# Patient Record
Sex: Male | Born: 1950
Health system: Southern US, Community
[De-identification: ages and names within clinical notes are randomized; demographics above are authoritative.]

## PROBLEM LIST (undated history)

## (undated) HISTORY — PX: SHOULDER SURGERY: SHX246

## (undated) HISTORY — PX: BACK SURGERY: SHX140

## (undated) HISTORY — PX: HEMORRHOID SURGERY: SHX153

---

## 1999-05-20 ENCOUNTER — Emergency Department (HOSPITAL_COMMUNITY): Admission: EM | Admit: 1999-05-20 | Discharge: 1999-05-20 | Payer: Self-pay | Admitting: Emergency Medicine

## 2009-05-29 ENCOUNTER — Emergency Department (HOSPITAL_BASED_OUTPATIENT_CLINIC_OR_DEPARTMENT_OTHER): Admission: EM | Admit: 2009-05-29 | Discharge: 2009-05-29 | Payer: Self-pay | Admitting: Emergency Medicine

## 2009-10-05 ENCOUNTER — Emergency Department (HOSPITAL_BASED_OUTPATIENT_CLINIC_OR_DEPARTMENT_OTHER): Admission: EM | Admit: 2009-10-05 | Discharge: 2009-10-05 | Payer: Self-pay | Admitting: Emergency Medicine

## 2010-07-09 LAB — GRAM STAIN

## 2010-07-09 LAB — SYNOVIAL CELL COUNT + DIFF, W/ CRYSTALS: Lymphocytes-Synovial Fld: 20 % (ref 0–20)

## 2010-07-09 LAB — BODY FLUID CULTURE: Culture: NO GROWTH

## 2010-07-09 LAB — CULTURE, BODY FLUID W GRAM STAIN -BOTTLE: Culture: NO GROWTH

## 2010-07-09 LAB — PATHOLOGIST SMEAR REVIEW

## 2010-07-09 LAB — GLUCOSE, SYNOVIAL FLUID: Glucose, Synovial Fluid: 85 mg/dL

## 2010-07-13 LAB — CBC
Hemoglobin: 13.5 g/dL (ref 13.0–17.0)
MCHC: 34.5 g/dL (ref 30.0–36.0)
RBC: 4.51 MIL/uL (ref 4.22–5.81)

## 2010-07-13 LAB — URINALYSIS, ROUTINE W REFLEX MICROSCOPIC
Ketones, ur: NEGATIVE mg/dL
Nitrite: NEGATIVE
Protein, ur: NEGATIVE mg/dL
Urobilinogen, UA: 1 mg/dL (ref 0.0–1.0)
pH: 6.5 (ref 5.0–8.0)

## 2010-07-13 LAB — COMPREHENSIVE METABOLIC PANEL
ALT: 10 U/L (ref 0–53)
Alkaline Phosphatase: 79 U/L (ref 39–117)
CO2: 29 mEq/L (ref 19–32)
Calcium: 9.1 mg/dL (ref 8.4–10.5)
GFR calc non Af Amer: >60 mL/min (ref >60)
Glucose, Bld: 74 mg/dL (ref 70–99)
Sodium: 142 mEq/L (ref 135–145)

## 2013-12-13 ENCOUNTER — Encounter (HOSPITAL_BASED_OUTPATIENT_CLINIC_OR_DEPARTMENT_OTHER): Payer: Self-pay | Admitting: Emergency Medicine

## 2013-12-13 ENCOUNTER — Emergency Department (HOSPITAL_BASED_OUTPATIENT_CLINIC_OR_DEPARTMENT_OTHER)
Admission: EM | Admit: 2013-12-13 | Discharge: 2013-12-13 | Disposition: A | Discharge status: Home / Self Care | Payer: Medicare Other | Attending: Emergency Medicine | Admitting: Emergency Medicine

## 2013-12-13 DIAGNOSIS — F17200 Nicotine dependence, unspecified, uncomplicated: Secondary | ICD-10-CM | POA: Diagnosis not present

## 2013-12-13 DIAGNOSIS — M7042 Prepatellar bursitis, left knee: Secondary | ICD-10-CM

## 2013-12-13 DIAGNOSIS — M7040 Prepatellar bursitis, unspecified knee: Secondary | ICD-10-CM | POA: Insufficient documentation

## 2013-12-13 DIAGNOSIS — M25569 Pain in unspecified knee: Secondary | ICD-10-CM | POA: Insufficient documentation

## 2013-12-13 DIAGNOSIS — M7090 Unspecified soft tissue disorder related to use, overuse and pressure of unspecified site: Secondary | ICD-10-CM | POA: Insufficient documentation

## 2013-12-13 MED ORDER — HYDROCODONE-ACETAMINOPHEN 5-325 MG PO TABS: 2.0000 | ORAL_TABLET | ORAL | Status: DC | PRN

## 2013-12-13 MED ORDER — IBUPROFEN 800 MG PO TABS
800.0000 mg | ORAL_TABLET | Freq: Once | ORAL | Status: AC
  Administered 2013-12-13: 800 mg via ORAL
  Filled 2013-12-13: qty 1

## 2013-12-13 MED ORDER — HYDROCODONE-ACETAMINOPHEN 5-325 MG PO TABS
1.0000 | ORAL_TABLET | Freq: Once | ORAL | Status: AC
  Administered 2013-12-13: 1 via ORAL
  Filled 2013-12-13: qty 1

## 2013-12-13 MED ORDER — NAPROXEN 500 MG PO TABS: 500.0000 mg | ORAL_TABLET | Freq: Two times a day (BID) | ORAL | Status: DC

## 2013-12-13 NOTE — ED Notes (Signed)
PT discharged to home with family. NAD. 

## 2013-12-13 NOTE — ED Provider Notes (Signed)
CSN: [id number]     Arrival date & time 12/13/13  1806 History  This chart was scribed for Rolland Porter, MD by Evon Slack, ED Scribe. This patient was seen in room MH03/MH03 and the patient's care was started at 6:51 PM.     Chief Complaint  Patient presents with  . Knee Pain   Patient is a 63 y.o. male presenting with knee pain. The history is provided by the patient. No language interpreter was used.  Knee Pain Associated symptoms: no fatigue and no fever    HPI Comments: Donald Frazier is a 63 y.o. male who presents to the Emergency Department complaining of left knee pain onset 2 weeks prior. He states the pain sometimes radiates down into his leg and his back.He states that he is constantly kneeling on the knee because he works in [employer].   He states there was no injury to the knee prior to arrival.     History reviewed. No pertinent past medical history. History reviewed. No pertinent past surgical history. No family history on file. History  Substance Use Topics  . Smoking status: Current Every Day Smoker  . Smokeless tobacco: Not on file  . Alcohol Use: Not on file    Review of Systems  Constitutional: Negative for fever, chills, diaphoresis, appetite change and fatigue.  HENT: Negative for mouth sores, sore throat and trouble swallowing.   Eyes: Negative for visual disturbance.  Respiratory: Negative for cough, chest tightness, shortness of breath and wheezing.   Cardiovascular: Negative for chest pain.  Gastrointestinal: Negative for nausea, vomiting, abdominal pain, diarrhea and abdominal distention.  Endocrine: Negative for polydipsia, polyphagia and polyuria.  Genitourinary: Negative for dysuria, frequency and hematuria.  Musculoskeletal: Positive for arthralgias. Negative for gait problem.  Skin: Negative for color change, pallor and rash.  Neurological: Negative for dizziness, syncope, light-headedness and headaches.  Hematological: Does not  bruise/bleed easily.  Psychiatric/Behavioral: Negative for behavioral problems and confusion.    Allergies  Review of patient's allergies indicates no known allergies.  Home Medications   Prior to Admission medications   Medication Sig Start Date End Date Taking? Authorizing Provider  HYDROcodone-acetaminophen (NORCO/VICODIN) 5-325 MG per tablet Take 2 tablets by mouth every 4 (four) hours as needed. 12/13/13   Rolland Porter, MD  naproxen (NAPROSYN) 500 MG tablet Take 1 tablet (500 mg total) by mouth 2 (two) times daily. 12/13/13   Rolland Porter, MD   Triage Vitals: BP 129/68  Pulse 78  Temp(Src) 98.3 F (36.8 C) (Oral)  Resp 18  Ht 6' 2.5" (1.892 m)  Wt 183 lb (83.008 kg)  BMI 23.19 kg/m2  SpO2 96%  Physical Exam  Musculoskeletal:  Left Knee: Diffuse swelling anteriorly, palpable distention of pre-patellar bursa   no palpable effusion or ballottement of the patella. No swelling posteriorly to suggest Baker's cyst.  ED Course  Procedures (including critical care time) DIAGNOSTIC STUDIES: Oxygen Saturation is 96% on RA, adequate by my interpretation.    COORDINATION OF CARE:    Labs Review Labs Reviewed - No data to display  Imaging Review No results found.   EKG Interpretation None      MDM   Final diagnoses:  Prepatellar bursitis, left  Housemaid's knee, left        I personally performed the services described in this documentation, which was scribed in my presence. The recorded information has been reviewed and is accurate.    Rolland Porter, MD 12/13/13 1910

## 2013-12-13 NOTE — ED Notes (Signed)
Pt here for pain and swelling to left knee.  Not associated with any trauma.

## 2013-12-13 NOTE — Discharge Instructions (Signed)
Bursitis Bursitis is a swelling and soreness (inflammation) of a fluid-filled sac (bursa) that overlies and protects a joint. It can be caused by injury, overuse of the joint, arthritis or infection. The joints most likely to be affected are the elbows, shoulders, hips and knees. HOME CARE INSTRUCTIONS   Apply ice to the affected area for 15-20 minutes each hour while awake for 2 days. Put the ice in a plastic bag and place a towel between the bag of ice and your skin.  Rest the injured joint as much as possible, but continue to put the joint through a full range of motion, 4 times per day. (The shoulder joint especially becomes rapidly "frozen" if not used.) When the pain lessens, begin normal slow movements and usual activities.  Only take over-the-counter or prescription medicines for pain, discomfort or fever as directed by your caregiver.  Your caregiver may recommend draining the bursa and injecting medicine into the bursa. This may help the healing process.  Follow all instructions for follow-up with your caregiver. This includes any orthopedic referrals, physical therapy and rehabilitation. Any delay in obtaining necessary care could result in a delay or failure of the bursitis to heal and chronic pain. SEEK IMMEDIATE MEDICAL CARE IF:   Your pain increases even during treatment.  You develop an oral temperature above 102 F (38.9 C) and have heat and inflammation over the involved bursa. MAKE SURE YOU:   Understand these instructions.  Will watch your condition.  Will get help right away if you are not doing well or get worse. Document Released: 04/06/2000 Document Revised: 07/02/2011 Document Reviewed: 06/29/2013 [employer] Patient Information 2015 [place], [place]. This information is not intended to replace advice given to you by your health care provider. Make sure you discuss any questions you have with your health care provider.  Prepatellar Bursitis with Rehab  Bursitis is  a condition that is characterized by inflammation of a bursa. Kateri Mc exists in many areas of the body. They are fluid-filled sacs that lie between a soft tissue (skin, tendon, or ligament) and a bone, and they reduce friction between the structures as well as the stress placed on the soft tissue. Prepatellar bursitis is inflammation of the bursa that lies between the skin and the kneecap (patella). This condition often causes pain over the patella. SYMPTOMS   Pain, tenderness, and/or inflammation over the patella.  Pain that worsens with movement of the knee joint.  Decreased range of motion for the knee joint.  A crackling sound (crepitation) when the bursa is moved or touched.  Occasionally, painless swelling of the bursa. CAUSES  Bursitis is caused by damage to the bursa, which results in an inflammatory response. Common mechanisms of injury include:  Direct trauma to the front of the knee.  Repetitive and/or stressful use of the knee. RISK INCREASES WITH:  Activities in which kneeling and/or falling on one's knees is likely (volleyball or football).  Repetitive and stressful training, especially if it involves running on hills.  Improper training techniques, such as a sudden increase in the intensity, frequency, or duration of training.  Failure to warm up properly before activity.  Poor technique.  Artificial turf. PREVENTION   Avoid kneeling or falling on your knees.  Warm up and stretch properly before activity.  Allow for adequate recovery between workouts.  Maintain physical fitness:  Strength, flexibility, and endurance.  Cardiovascular fitness.  Learn and use proper technique. When possible, have a coach correct improper technique.  Wear properly fitted  and padded protective equipment (kneepads). PROGNOSIS  If treated properly, then the symptoms of prepatellar bursitis usually resolve within 2 weeks. RELATED COMPLICATIONS   Recurrent symptoms that result  in a chronic problem.  Prolonged healing time, if improperly treated or reinjured.  Limited range of motion.  Infection of bursa.  Chronic inflammation or scarring of bursa. TREATMENT  Treatment initially involves the use of ice and medication to help reduce pain and inflammation. The use of strengthening and stretching exercises may help reduce pain with activity, especially those of the quadriceps and hamstring muscles. These exercises may be performed at home or with referral to a therapist. Your caregiver may recommend kneepads when you return to playing sports, in order to reduce the stress on the prepatellar bursa. If symptoms persist despite treatment, then your caregiver may drain fluid out with a needle (aspirate) the bursa. If symptoms persist for greater than 6 months despite nonsurgical (conservative) treatment, then surgery may be recommended to remove the bursa.  MEDICATION  If pain medication is necessary, then nonsteroidal anti-inflammatory medications, such as aspirin and ibuprofen, or other minor pain relievers, such as acetaminophen, are often recommended.  Do not take pain medication for 7 days before surgery.  Prescription pain relievers may be given if deemed necessary by your caregiver. Use only as directed and only as much as you need.  Corticosteroid injections may be given by your caregiver. These injections should be reserved for the most serious cases, because they may only be given a certain number of times. HEAT AND COLD  Cold treatment (icing) relieves pain and reduces inflammation. Cold treatment should be applied for 10 to 15 minutes every 2 to 3 hours for inflammation and pain and immediately after any activity that aggravates your symptoms. Use ice packs or massage the area with a piece of ice (ice massage).  Heat treatment may be used prior to performing the stretching and strengthening activities prescribed by your caregiver, physical therapist, or  athletic trainer. Use a heat pack or soak the injury in warm water. SEEK MEDICAL CARE IF:  Treatment seems to offer no benefit, or the condition worsens.  Any medications produce adverse side effects.

## 2014-03-07 ENCOUNTER — Emergency Department (HOSPITAL_BASED_OUTPATIENT_CLINIC_OR_DEPARTMENT_OTHER): Payer: Medicare Other

## 2014-03-07 ENCOUNTER — Encounter (HOSPITAL_BASED_OUTPATIENT_CLINIC_OR_DEPARTMENT_OTHER): Payer: Self-pay | Admitting: *Deleted

## 2014-03-07 ENCOUNTER — Emergency Department (HOSPITAL_BASED_OUTPATIENT_CLINIC_OR_DEPARTMENT_OTHER)
Admission: EM | Admit: 2014-03-07 | Discharge: 2014-03-07 | Disposition: A | Discharge status: Home / Self Care | Payer: Medicare Other | Attending: Emergency Medicine | Admitting: Emergency Medicine

## 2014-03-07 DIAGNOSIS — Y92410 Unspecified street and highway as the place of occurrence of the external cause: Secondary | ICD-10-CM | POA: Diagnosis not present

## 2014-03-07 DIAGNOSIS — Y9389 Activity, other specified: Secondary | ICD-10-CM | POA: Diagnosis not present

## 2014-03-07 DIAGNOSIS — S161XXA Strain of muscle, fascia and tendon at neck level, initial encounter: Secondary | ICD-10-CM

## 2014-03-07 DIAGNOSIS — Z791 Long term (current) use of non-steroidal anti-inflammatories (NSAID): Secondary | ICD-10-CM | POA: Diagnosis not present

## 2014-03-07 DIAGNOSIS — Z720 Tobacco use: Secondary | ICD-10-CM | POA: Diagnosis not present

## 2014-03-07 DIAGNOSIS — Z79899 Other long term (current) drug therapy: Secondary | ICD-10-CM | POA: Insufficient documentation

## 2014-03-07 DIAGNOSIS — S39012A Strain of muscle, fascia and tendon of lower back, initial encounter: Secondary | ICD-10-CM | POA: Diagnosis not present

## 2014-03-07 DIAGNOSIS — S199XXA Unspecified injury of neck, initial encounter: Secondary | ICD-10-CM | POA: Diagnosis present

## 2014-03-07 DIAGNOSIS — Y998 Other external cause status: Secondary | ICD-10-CM | POA: Insufficient documentation

## 2014-03-07 MED ORDER — HYDROCODONE-ACETAMINOPHEN 5-325 MG PO TABS
1.0000 | ORAL_TABLET | Freq: Once | ORAL | Status: AC
  Administered 2014-03-07: 1 via ORAL
  Filled 2014-03-07: qty 1

## 2014-03-07 MED ORDER — HYDROCODONE-ACETAMINOPHEN 5-325 MG PO TABS: 1.0000 | ORAL_TABLET | Freq: Four times a day (QID) | ORAL | Status: DC | PRN

## 2014-03-07 MED ORDER — CYCLOBENZAPRINE HCL 10 MG PO TABS: 10.0000 mg | ORAL_TABLET | Freq: Two times a day (BID) | ORAL | Status: DC | PRN
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
## 2014-03-07 NOTE — Discharge Instructions (Signed)
X-rays of the back without any acute bony injury. CAT scan of the neck without any acute bony injury. Take the medication prescribed as directed. Return for any new or worse symptoms. Would expect to be sore and stiff for the next few days.
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
## 2014-07-08 ENCOUNTER — Ambulatory Visit (INDEPENDENT_AMBULATORY_CARE_PROVIDER_SITE_OTHER): Payer: Medicare PPO | Admitting: Medical

## 2014-07-08 ENCOUNTER — Encounter: Payer: Self-pay | Admitting: Medical

## 2014-07-08 ENCOUNTER — Ambulatory Visit: Payer: Self-pay | Admitting: Medical

## 2014-07-08 VITALS — BP 158/80 | HR 55 | Temp 98.2°F | Ht 74.0 in | Wt 169.2 lb

## 2014-07-08 DIAGNOSIS — R1013 Epigastric pain: Secondary | ICD-10-CM

## 2014-07-08 DIAGNOSIS — R109 Unspecified abdominal pain: Secondary | ICD-10-CM | POA: Insufficient documentation

## 2014-07-08 MED ORDER — ONDANSETRON 8 MG PO TBDP
8.0000 mg | ORAL_TABLET | Freq: Three times a day (TID) | ORAL | Status: DC | PRN
Start: 2014-07-08 — End: 2015-12-24

## 2014-07-08 MED ORDER — RANITIDINE HCL 150 MG PO CAPS: 150.0000 mg | ORAL_CAPSULE | Freq: Two times a day (BID) | ORAL | Status: DC

## 2014-07-08 NOTE — Patient Instructions (Signed)
Pain in the abdomen Possible gerd. Stop alcohol, sodas and fried foods. Start ranitidine rx. Start zofran rx.  Get cbc,cmp, and lipase. Turn in hemocult cards.  If pain worsens or changes then ED evaluation.  Follow up on Monday or as needed.      Make sure you are hydrating with [employer] water. If labs are negative then start bland diet.

## 2014-07-08 NOTE — Assessment & Plan Note (Signed)
Possible gerd. Stop alcohol, sodas and fried foods. Start ranitidine rx. Start zofran rx.  Get cbc,cmp, and lipase. Turn in hemocult cards.  If pain worsens or changes then ED evaluation.  Follow up on Monday or as needed.

## 2014-07-08 NOTE — Progress Notes (Signed)
Subjective:    Patient ID: Donald Frazier HeightJames A Intriago, male    DOB: 09/23/1950, 64 y.o.   MRN: [id number]  HPI   I have reviewed pt PMH, PSH, FH, Social History and Surgical History  Pt has no PMH. No chronic medical problems.   PSH reviewed.  FH- reviewed.  Pt works pulling parts off of cars, No exercise, coffee 2 cups a day, Married- 3 children.  Pt has not had any regular physicals in years.   Pt states his stomach has been upset for 2 days. Pt states when eats has a lot of gas. Some possible reflux. Mild pain. Some hic ups. He takes peptombismol and will quit. Pt vomited one time.(After drinking soda).  Pt last 2 days drinking some sodas.  Pt has not eaten for 2 days. When he eats will eat fried foods.   hic ups came back during interview.  No black stools. No bloody stools.     Review of Systems  Constitutional: Negative for fever, chills and fatigue.  HENT: Negative.   Respiratory: Negative for cough, chest tightness, shortness of breath and wheezing.   Cardiovascular: Negative for chest pain and palpitations.  Gastrointestinal: Positive for vomiting and abdominal pain. Negative for nausea, diarrhea, constipation, blood in stool, abdominal distention, anal bleeding and rectal pain.       Low level pain abdomen now. Hic ups.  Vomted one time yesterday.  Skin: Negative for pallor.  Neurological: Negative for dizziness, syncope, facial asymmetry, weakness and headaches.  Hematological: Negative for adenopathy. Does not bruise/bleed easily.  Psychiatric/Behavioral: Negative for behavioral problems and confusion.       Objective:   Physical Exam  General Appearance- Not in acute distress.  HEENT Eyes- Scleraeral/Conjuntiva-bilat- Not Yellow. Mouth & Throat- Normal.  Chest and Lung Exam Auscultation: Breath sounds:-Normal. Adventitious sounds:- No Adventitious sounds.  Cardiovascular Auscultation:Rythm - Regular. Heart Sounds -Normal heart  sounds.  Abdomen Inspection:-Inspection Normal.  Palpation/Perucssion: Palpation and Percussion of the abdomen reveal- Non Tender, No Rebound tenderness, No rigidity(Guarding) and No Palpable abdominal masses.  Liver:-Normal.  Spleen:- Normal.   Back- no cva tenderness.  No past medical history on file.  History   Social History  . Marital Status: Married    Spouse Name: N/A  . Number of Children: N/A  . Years of Education: N/A   Occupational History  . Not on file.   Social History Main Topics  . Smoking status: Current Every Day Smoker -- 1.00 packs/day for 60 years  . Smokeless tobacco: Not on file  . Alcohol Use: Yes     Comment: 1 can beer a night.(no heavy drinking recently)  . Drug Use: No  . Sexual Activity: Not on file   Other Topics Concern  . Not on file   Social History Narrative    Past Surgical History  Procedure Laterality Date  . Back surgery    . Shoulder surgery Left   . Hemorrhoid surgery      Family History  Problem Relation Age of Onset  . Hypertension Mother   . Diabetes Mother   . Hypertension Father   . Diabetes Father     No Known Allergies  No current outpatient prescriptions on file prior to visit.   No current facility-administered medications on file prior to visit.    BP 158/80 mmHg  Pulse 55  Temp(Src) 98.2 F (36.8 C) (Oral)  Ht 6\' 2"  (1.88 m)  Wt 169 lb 3.2 oz (76.749 kg)  BMI 21.71 kg/m2  SpO2 98%       Assessment & Plan:

## 2014-07-08 NOTE — Progress Notes (Signed)
Pre visit review using our clinic review tool, if applicable. No additional management support is needed unless otherwise documented below in the visit note. 

## 2014-07-12 ENCOUNTER — Telehealth: Payer: Self-pay | Admitting: Medical

## 2014-07-12 ENCOUNTER — Other Ambulatory Visit: Payer: Medicare PPO

## 2014-07-12 DIAGNOSIS — R1013 Epigastric pain: Secondary | ICD-10-CM

## 2014-07-12 LAB — CBC WITH DIFFERENTIAL/PLATELET
BASOS ABS: 0 10*3/uL (ref 0.0–0.1)
Basophils Relative: 0.3 % (ref 0.0–3.0)
EOS ABS: 0.2 10*3/uL (ref 0.0–0.7)
Eosinophils Relative: 3.3 % (ref 0.0–5.0)
HCT: 44.3 % (ref 39.0–52.0)
Hemoglobin: 14.9 g/dL (ref 13.0–17.0)
Lymphocytes Relative: 24.8 % (ref 12.0–46.0)
Lymphs Abs: 1.7 10*3/uL (ref 0.7–4.0)
MCHC: 33.6 g/dL (ref 30.0–36.0)
MCV: 86.6 fl (ref 78.0–100.0)
MONO ABS: 0.5 10*3/uL (ref 0.1–1.0)
Monocytes Relative: 7.1 % (ref 3.0–12.0)
NEUTROS PCT: 64.5 % (ref 43.0–77.0)
Neutro Abs: 4.5 10*3/uL (ref 1.4–7.7)
PLATELETS: 327 10*3/uL (ref 150.0–400.0)
RBC: 5.11 Mil/uL (ref 4.22–5.81)
RDW: 16.1 % — AB (ref 11.5–15.5)
WBC: 6.9 10*3/uL (ref 4.0–10.5)

## 2014-07-12 LAB — COMPREHENSIVE METABOLIC PANEL
ALBUMIN: 3.9 g/dL (ref 3.5–5.2)
ALK PHOS: 81 U/L (ref 39–117)
ALT: 9 U/L (ref 0–53)
AST: 15 U/L (ref 0–37)
BUN: 12 mg/dL (ref 6–23)
CALCIUM: 9.4 mg/dL (ref 8.4–10.5)
CHLORIDE: 104 meq/L (ref 96–112)
CO2: 25 mEq/L (ref 19–32)
CREATININE: 1.1 mg/dL (ref 0.50–1.35)
GLUCOSE: 90 mg/dL (ref 70–99)
POTASSIUM: 4.1 meq/L (ref 3.5–5.3)
Sodium: 138 mEq/L (ref 135–145)
Total Bilirubin: 0.3 mg/dL (ref 0.2–1.2)
Total Protein: 7 g/dL (ref 6.0–8.3)

## 2014-07-12 LAB — LIPASE: LIPASE: 31 U/L (ref 0–75)

## 2014-07-12 NOTE — Telephone Encounter (Signed)
Caller name: Clydell HakimBlackwell,Shirley A Relation to pt: spouse  Call back number: [phone] / [phone]   Reason for call:  Pt would like to discuss medication ranitidine (ZANTAC) 150 MG capsule

## 2014-07-13 ENCOUNTER — Telehealth: Payer: Self-pay | Admitting: Medical

## 2014-07-13 NOTE — Telephone Encounter (Signed)
Notified wife of lab results today.

## 2014-07-13 NOTE — Telephone Encounter (Signed)
Spoke with patients wife regarding Ranitidine. Message forwarded to E Saguier,PA.

## 2014-07-13 NOTE — Telephone Encounter (Signed)
I called pt and did talk to his wife. He does not have insurance coverage and I explained to them that zantac is usually cheaper and that rx meds PPI type can be very expensive. I [facility] and at one time ranitidine was on the 4$ dollar list. I want pt to take the  Ranitidine med and see if symptoms. Resolve. Follow up in 5-6 wks. At that point decide if will switch to prilosec otc or if refer to GI. I reminded her to have him do stool cards test for blood.Explained to do this since if heme positive would go ahead and refer directly to GI.

## 2014-08-10 ENCOUNTER — Encounter: Payer: Self-pay | Admitting: Medical

## 2014-08-10 ENCOUNTER — Ambulatory Visit (INDEPENDENT_AMBULATORY_CARE_PROVIDER_SITE_OTHER): Payer: Medicare PPO | Admitting: Medical

## 2014-08-10 VITALS — BP 132/70 | HR 70 | Temp 98.2°F | Ht 74.0 in | Wt 170.0 lb

## 2014-08-10 DIAGNOSIS — M25511 Pain in right shoulder: Secondary | ICD-10-CM | POA: Diagnosis not present

## 2014-08-10 DIAGNOSIS — M25519 Pain in unspecified shoulder: Secondary | ICD-10-CM | POA: Insufficient documentation

## 2014-08-10 MED ORDER — TRAMADOL HCL 50 MG PO TABS
50.0000 mg | ORAL_TABLET | Freq: Four times a day (QID) | ORAL | Status: DC | PRN
Start: 2014-08-10 — End: 2015-12-24

## 2014-08-10 MED ORDER — KETOROLAC TROMETHAMINE 60 MG/2ML IM SOLN
60.0000 mg | Freq: Once | INTRAMUSCULAR | Status: AC
  Administered 2014-08-10: 60 mg via INTRAMUSCULAR

## 2014-08-10 MED ORDER — DICLOFENAC SODIUM 75 MG PO TBEC: 75.0000 mg | DELAYED_RELEASE_TABLET | Freq: Two times a day (BID) | ORAL | Status: DC

## 2014-08-10 NOTE — Progress Notes (Signed)
Pre visit review using our clinic review tool, if applicable. No additional management support is needed unless otherwise documented below in the visit note. 

## 2014-08-10 NOTE — Addendum Note (Signed)
Addended by: Lurline HareARTER, Heer Justiss E on: 08/10/2014 11:33 AM   Modules accepted: Orders

## 2014-08-10 NOTE — Assessment & Plan Note (Signed)
Possible rotator cuff injury/pain. Will refer to orthopedist. Rx dicolfenac and tramadol.   Toradol 60 mg im.

## 2014-08-10 NOTE — Patient Instructions (Addendum)
Pain in joint, shoulder region Possible rotator cuff injury/pain. Will refer to orthopedist. Rx dicolfenac and tramadol.   Toradol 60 mg im.     Xray of rt shoulder today. Work note/excuse.  Follow up as needed after ortho eval.  Stop otc nsaids advised.

## 2014-08-10 NOTE — Progress Notes (Signed)
   Subjective:    Patient ID: Donald Frazier HeightJames A Vandervelden, male    DOB: 06/01/1950, 64 y.o.   MRN: [id number]  HPI  Pt in with rt shoulder pain. Hx of rt shoulder pain. Pt has known rotator cuff injury years ago. Pain is moderate to severe no . Pt has limited range of motion. Last week pain got a lot worse. Pt does a lot of heavy work(But no recent acute injury/fall). Pt has seen orthopedist in past. This was about 12 years ago.   Pt takes ibuprofen otc for pain.  Pt has history of left shoulder rotator cuff surgery years ago. Did well with tx on that side.  Review of Systems  Constitutional: Negative for fever, chills and fatigue.  Respiratory: Negative for cough, shortness of breath and wheezing.   Cardiovascular: Negative for chest pain and palpitations.  Musculoskeletal:       Rt shloulder pain.  Neurological: Negative for dizziness and light-headedness.  Hematological: Negative for adenopathy. Does not bruise/bleed easily.   No past medical history on file.  History   Social History  . Marital Status: Married    Spouse Name: N/A  . Number of Children: N/A  . Years of Education: N/A   Occupational History  . Not on file.   Social History Main Topics  . Smoking status: Current Every Day Smoker -- 1.00 packs/day for 60 years  . Smokeless tobacco: Not on file  . Alcohol Use: Yes     Comment: 1 can beer a night.(no heavy drinking recently)  . Drug Use: No  . Sexual Activity: Not on file   Other Topics Concern  . Not on file   Social History Narrative    Past Surgical History  Procedure Laterality Date  . Back surgery    . Shoulder surgery Left   . Hemorrhoid surgery      Family History  Problem Relation Age of Onset  . Hypertension Mother   . Diabetes Mother   . Hypertension Father   . Diabetes Father     No Known Allergies  Current Outpatient Prescriptions on File Prior to Visit  Medication Sig Dispense Refill  . ondansetron (ZOFRAN ODT) 8 MG disintegrating  tablet Take 1 tablet (8 mg total) by mouth every 8 (eight) hours as needed for nausea or vomiting. 9 tablet 0  . ranitidine (ZANTAC) 150 MG capsule Take 1 capsule (150 mg total) by mouth 2 (two) times daily. 60 capsule 0   No current facility-administered medications on file prior to visit.    BP 132/70 mmHg  Pulse 70  Temp(Src) 98.2 F (36.8 C) (Oral)  Ht 6\' 2"  (1.88 m)  Wt 170 lb (77.111 kg)  BMI 21.82 kg/m2  SpO2 95%       Objective:   Physical Exam  General- No acute distress. Pleasant patient. Neck- Full range of motion, no jvd Lungs- Clear, even and unlabored. Heart- regular rate and rhythm. Neurologic- CNII- XII grossly intact.  Rt shoulder- extreme limited rom. He can't abduct his rt arm much at all. When he initiates abduction reports almost immediate pain.       Assessment & Plan:

## 2014-08-23 ENCOUNTER — Telehealth: Payer: Self-pay | Admitting: Medical

## 2014-08-23 NOTE — Telephone Encounter (Signed)
Caller name: Talbert ForestShirley Relation to pt: wife Call back number: [phone] or [phone] Pharmacy:  Reason for call:   Patient wife states that patient has an appt with ortho on 09/10/14 with Dr. Thomasena Edisollins. Referral has been placed but [employer] needs to be authorized.

## 2014-12-02 ENCOUNTER — Ambulatory Visit: Payer: Self-pay | Admitting: Family Medicine

## 2015-01-28 ENCOUNTER — Encounter: Payer: Self-pay | Admitting: Medical

## 2015-01-28 ENCOUNTER — Ambulatory Visit (INDEPENDENT_AMBULATORY_CARE_PROVIDER_SITE_OTHER): Payer: Medicare PPO | Admitting: Medical

## 2015-01-28 ENCOUNTER — Ambulatory Visit: Payer: Medicare PPO | Admitting: Medical

## 2015-01-28 ENCOUNTER — Other Ambulatory Visit (HOSPITAL_COMMUNITY)
Admission: RE | Admit: 2015-01-28 | Discharge: 2015-01-28 | Disposition: A | Discharge status: Home / Self Care | Payer: Medicare PPO | Source: Ambulatory Visit | Attending: Medical | Admitting: Medical

## 2015-01-28 VITALS — BP 130/80 | HR 86 | Temp 97.4°F | Ht 74.0 in | Wt 168.8 lb

## 2015-01-28 DIAGNOSIS — Z113 Encounter for screening for infections with a predominantly sexual mode of transmission: Secondary | ICD-10-CM | POA: Insufficient documentation

## 2015-01-28 DIAGNOSIS — Z23 Encounter for immunization: Secondary | ICD-10-CM

## 2015-01-28 DIAGNOSIS — M25511 Pain in right shoulder: Secondary | ICD-10-CM

## 2015-01-28 DIAGNOSIS — R030 Elevated blood-pressure reading, without diagnosis of hypertension: Secondary | ICD-10-CM

## 2015-01-28 DIAGNOSIS — Z202 Contact with and (suspected) exposure to infections with a predominantly sexual mode of transmission: Secondary | ICD-10-CM | POA: Diagnosis not present

## 2015-01-28 DIAGNOSIS — B49 Unspecified mycosis: Secondary | ICD-10-CM | POA: Diagnosis not present

## 2015-01-28 DIAGNOSIS — R1013 Epigastric pain: Secondary | ICD-10-CM

## 2015-01-28 DIAGNOSIS — IMO0001 Reserved for inherently not codable concepts without codable children: Secondary | ICD-10-CM

## 2015-01-28 NOTE — Assessment & Plan Note (Addendum)
Pt states his prior pain resolved with improved diet. Also he cut back dramatically on alcohol use.

## 2015-01-28 NOTE — Patient Instructions (Addendum)
For std screening will do studies through the urine but also get hiv blood test.  Continue healthier diet and avoid alcohol. This seemed to resolve/help your stomach pain.If pain returns let us know.(hx of abd pain and reviewed today)  If shoulder pain returns let us know.(hx of and reviewed today)  For fungal infection groin area use lamsil otc or lotrisone otc. Keep area dry as possible.   Your bp is well controlled today and will continue to follow bp in future.  Schedule Wellness exam/G0 component with Paulino Rily and then can schedule physical exam component with me a month after you see Paulino Rily.

## 2015-01-28 NOTE — Assessment & Plan Note (Signed)
He update me that shoulder feels better.

## 2015-01-28 NOTE — Progress Notes (Signed)
Subjective:    Patient ID: Donald Frazier, male    DOB: 1950-08-17, 64 y.o.   MRN: [id number]  HPI  Pt in for check up. Pt has blood pressure mild elevation in past but not presently. No hx of htn diagnosis.  Pt has occasional groin itching worse when he sweats. Worse in summer. Occuring for a couple of years.  Pt states his wife is concerned for std. Pt denies any symptoms but he is willing to to std screening test.  His wife has no symptoms. Pt admits to some relations outside of his marriage.    Review of Systems  Constitutional: Negative for fever, chills, diaphoresis, activity change and fatigue.  Respiratory: Negative for cough, chest tightness and shortness of breath.   Cardiovascular: Negative for chest pain, palpitations and leg swelling.  Gastrointestinal: Negative for nausea, vomiting and abdominal pain.  Genitourinary: Negative for dysuria, urgency, frequency, flank pain, discharge, penile swelling, scrotal swelling, penile pain and testicular pain.  Musculoskeletal: Negative for neck pain and neck stiffness.  Skin: Positive for rash.       Groin.  Neurological: Negative for dizziness, seizures, weakness, light-headedness and headaches.  Psychiatric/Behavioral: Negative for behavioral problems, confusion and agitation. The patient is not nervous/anxious.     No past medical history on file.  Social History   Social History  . Marital Status: Married    Spouse Name: N/A  . Number of Children: N/A  . Years of Education: N/A   Occupational History  . Not on file.   Social History Main Topics  . Smoking status: Current Every Day Smoker -- 1.00 packs/day for 60 years  . Smokeless tobacco: Not on file  . Alcohol Use: Yes     Comment: 1 can beer a night.(no heavy drinking recently)  . Drug Use: No  . Sexual Activity: Not on file   Other Topics Concern  . Not on file   Social History Narrative    Past Surgical History  Procedure Laterality Date  . Back  surgery    . Shoulder surgery Left   . Hemorrhoid surgery      Family History  Problem Relation Age of Onset  . Hypertension Mother   . Diabetes Mother   . Hypertension Father   . Diabetes Father     No Known Allergies  Current Outpatient Prescriptions on File Prior to Visit  Medication Sig Dispense Refill  . diclofenac (VOLTAREN) 75 MG EC tablet Take 1 tablet (75 mg total) by mouth 2 (two) times daily. 30 tablet 0  . ondansetron (ZOFRAN ODT) 8 MG disintegrating tablet Take 1 tablet (8 mg total) by mouth every 8 (eight) hours as needed for nausea or vomiting. 9 tablet 0  . ranitidine (ZANTAC) 150 MG capsule Take 1 capsule (150 mg total) by mouth 2 (two) times daily. 60 capsule 0  . traMADol (ULTRAM) 50 MG tablet Take 1 tablet (50 mg total) by mouth every 6 (six) hours as needed. 24 tablet 0   No current facility-administered medications on file prior to visit.    BP 130/80 mmHg  Pulse 86  Temp(Src) 97.4 F (36.3 C) (Oral)  Ht  (1.88 m)  Wt 168 lb 12.8 oz (76.567 kg)  BMI 21.66 kg/m2  SpO2 98%       Objective:   Physical Exam  General Mental Status- Alert. General Appearance- Not in acute distress.   Skin General: Color- Normal Color. Moisture- Normal Moisture.  Neck Carotid Arteries- Normal color.  Moisture- Normal Moisture. No carotid bruits. No JVD.  Chest and Lung Exam Auscultation: Breath Sounds:-Normal. CTA.  Cardiovascular Auscultation:Rythm- Regular,Rate and Rhythm. Murmurs & Other Heart Sounds:Auscultation of the heart reveals- No Murmurs.  Abdomen Inspection:-Inspeection Normal. Palpation/Percussion:Note:No mass. Palpation and Percussion of the abdomen reveal- Non Tender, Non Distended + BS, no rebound or guarding.  Genital- no dc from penis. No testicle tenderness. No lesion on penis. Some hyperpigmentation present on groin region both sides.  Neurologic Cranial Nerve exam:- CN III-XII intact(No nystagmus), symmetric smile. Strength:- 5/5  equal and symmetric strength both upper and lower extremities.      Assessment & Plan:  For std screening will do studies through the urine but also get hiv blood test and rpr.  Continue healthier diet and avoid alcohol. This seemed to resolve/help your stomach pain.If pain returns let us know.(Kore[place]x of abd pain and reviewed today)  If shoulder pain returns let us know.(hx of and reviewed today)  For fungal infection groin area use lamsil otc or lotrisone otc. Keep area dry as possible.   Your bp is well controlled today and will continue to follow bp in future.  Schedule Wellness exam/G0 component with Paulino Rily and then can schedule physical exam component with me a month after you see Paulino Rily.

## 2015-01-28 NOTE — Progress Notes (Signed)
Pre visit review using our clinic review tool, if applicable. No additional management support is needed unless otherwise documented below in the visit note. 

## 2015-01-29 LAB — RPR

## 2015-01-29 LAB — HIV ANTIBODY (ROUTINE TESTING W REFLEX): HIV 1&2 Ab, 4th Generation: NONREACTIVE

## 2015-01-31 LAB — URINE CYTOLOGY ANCILLARY ONLY
Chlamydia: NEGATIVE
NEISSERIA GONORRHEA: NEGATIVE
Trichomonas: NEGATIVE

## 2015-02-03 ENCOUNTER — Telehealth: Payer: Self-pay | Admitting: Medical

## 2015-02-03 LAB — URINE CYTOLOGY ANCILLARY ONLY: Bacterial vaginitis: POSITIVE — AB

## 2015-02-03 MED ORDER — METRONIDAZOLE 500 MG PO TABS: 500.0000 mg | ORAL_TABLET | Freq: Three times a day (TID) | ORAL | Status: DC

## 2015-02-03 NOTE — Telephone Encounter (Signed)
rx flagyl  

## 2015-02-10 ENCOUNTER — Telehealth: Payer: Self-pay | Admitting: Medical

## 2015-02-10 NOTE — Telephone Encounter (Signed)
Caller name:Maya,Shirley A Relation to ZO:XWRUEApt:spouse  Call back number:[phone]   Reason for call:  Spouse states patient was dx at last appointment with a bacteria infection and patient takes the last of metroNIDAZOLE (FLAGYL) 500 MG tablet  Sunday and would like patient to drop off urine Monday 02/14/15 [place]. Please advise. Please call this afternoon after 3:30pm.

## 2015-02-10 NOTE — Telephone Encounter (Signed)
Edward please advise if you would like the pt to come in to give another urine sample.

## 2015-02-11 NOTE — Telephone Encounter (Signed)
Spoke with pt's wife and she wants pt to finish medication before he comes in to give a urine and she will have pt come in next week to drop off a sample.

## 2015-02-11 NOTE — Telephone Encounter (Signed)
Pt can come back in just to repeat urine ancillary test for bv.

## 2015-02-21 ENCOUNTER — Other Ambulatory Visit (HOSPITAL_COMMUNITY)
Admission: RE | Admit: 2015-02-21 | Discharge: 2015-02-21 | Disposition: A | Discharge status: Home / Self Care | Payer: Medicare PPO | Source: Ambulatory Visit | Attending: Medical | Admitting: Medical

## 2015-02-21 ENCOUNTER — Other Ambulatory Visit (INDEPENDENT_AMBULATORY_CARE_PROVIDER_SITE_OTHER): Payer: Medicare PPO

## 2015-02-21 DIAGNOSIS — Z113 Encounter for screening for infections with a predominantly sexual mode of transmission: Secondary | ICD-10-CM | POA: Insufficient documentation

## 2015-02-21 DIAGNOSIS — B9689 Other specified bacterial agents as the cause of diseases classified elsewhere: Secondary | ICD-10-CM

## 2015-02-21 DIAGNOSIS — A499 Bacterial infection, unspecified: Secondary | ICD-10-CM | POA: Diagnosis not present

## 2015-02-21 DIAGNOSIS — N760 Acute vaginitis: Secondary | ICD-10-CM

## 2015-02-23 LAB — URINE CYTOLOGY ANCILLARY ONLY: Bacterial vaginitis: NEGATIVE

## 2015-07-15 ENCOUNTER — Emergency Department (HOSPITAL_BASED_OUTPATIENT_CLINIC_OR_DEPARTMENT_OTHER): Payer: Medicare PPO

## 2015-07-15 ENCOUNTER — Emergency Department (HOSPITAL_COMMUNITY): Payer: Medicare PPO

## 2015-07-15 ENCOUNTER — Encounter (HOSPITAL_BASED_OUTPATIENT_CLINIC_OR_DEPARTMENT_OTHER): Payer: Self-pay | Admitting: *Deleted

## 2015-07-15 ENCOUNTER — Emergency Department (HOSPITAL_BASED_OUTPATIENT_CLINIC_OR_DEPARTMENT_OTHER)
Admission: EM | Admit: 2015-07-15 | Discharge: 2015-07-15 | Disposition: A | Discharge status: Home / Self Care | Payer: Medicare PPO | Attending: Emergency Medicine | Admitting: Emergency Medicine

## 2015-07-15 DIAGNOSIS — Z791 Long term (current) use of non-steroidal anti-inflammatories (NSAID): Secondary | ICD-10-CM | POA: Insufficient documentation

## 2015-07-15 DIAGNOSIS — F17200 Nicotine dependence, unspecified, uncomplicated: Secondary | ICD-10-CM | POA: Diagnosis not present

## 2015-07-15 DIAGNOSIS — Z792 Long term (current) use of antibiotics: Secondary | ICD-10-CM | POA: Diagnosis not present

## 2015-07-15 DIAGNOSIS — R042 Hemoptysis: Secondary | ICD-10-CM | POA: Insufficient documentation

## 2015-07-15 DIAGNOSIS — Z79899 Other long term (current) drug therapy: Secondary | ICD-10-CM | POA: Insufficient documentation

## 2015-07-15 LAB — CBC WITH DIFFERENTIAL/PLATELET
BASOS PCT: 0 %
Basophils Absolute: 0 10*3/uL (ref 0.0–0.1)
Eosinophils Absolute: 0.2 10*3/uL (ref 0.0–0.7)
Eosinophils Relative: 2 %
HEMATOCRIT: 39.3 % (ref 39.0–52.0)
HEMOGLOBIN: 12.9 g/dL — AB (ref 13.0–17.0)
LYMPHS ABS: 1.1 10*3/uL (ref 0.7–4.0)
LYMPHS PCT: 14 %
MCH: 29.1 pg (ref 26.0–34.0)
MCHC: 32.8 g/dL (ref 30.0–36.0)
MCV: 88.5 fL (ref 78.0–100.0)
MONO ABS: 0.5 10*3/uL (ref 0.1–1.0)
MONOS PCT: 7 %
NEUTROS ABS: 5.8 10*3/uL (ref 1.7–7.7)
NEUTROS PCT: 77 %
Platelets: 215 10*3/uL (ref 150–400)
RBC: 4.44 MIL/uL (ref 4.22–5.81)
RDW: 14.9 % (ref 11.5–15.5)
WBC: 7.6 10*3/uL (ref 4.0–10.5)

## 2015-07-15 LAB — COMPREHENSIVE METABOLIC PANEL
ALBUMIN: 3.7 g/dL (ref 3.5–5.0)
ALT: 12 U/L — AB (ref 17–63)
AST: 23 U/L (ref 15–41)
Alkaline Phosphatase: 74 U/L (ref 38–126)
Anion gap: 7 (ref 5–15)
BILIRUBIN TOTAL: 0.7 mg/dL (ref 0.3–1.2)
BUN: 14 mg/dL (ref 6–20)
CHLORIDE: 104 mmol/L (ref 101–111)
CO2: 25 mmol/L (ref 22–32)
CREATININE: 1.07 mg/dL (ref 0.61–1.24)
Calcium: 8.5 mg/dL — ABNORMAL LOW (ref 8.9–10.3)
GFR calc Af Amer: >60 mL/min (ref >60)
GLUCOSE: 156 mg/dL — AB (ref 65–99)
Potassium: 3.5 mmol/L (ref 3.5–5.1)
Sodium: 136 mmol/L (ref 135–145)
Total Protein: 6.7 g/dL (ref 6.5–8.1)

## 2015-07-15 LAB — APTT: APTT: 36 s (ref 24–37)

## 2015-07-15 LAB — PROTIME-INR
INR: 1.12 (ref 0.00–1.49)
PROTHROMBIN TIME: 14.6 s (ref 11.6–15.2)

## 2015-07-15 MED ORDER — LEVOFLOXACIN 750 MG PO TABS
750.0000 mg | ORAL_TABLET | Freq: Once | ORAL | Status: AC
  Administered 2015-07-15: 750 mg via ORAL
  Filled 2015-07-15: qty 1

## 2015-07-15 MED ORDER — LEVOFLOXACIN 750 MG PO TABS: 750.0000 mg | ORAL_TABLET | Freq: Once | ORAL | Status: DC

## 2015-07-15 MED ORDER — IOPAMIDOL (ISOVUE-300) INJECTION 61%
80.0000 mL | Freq: Once | INTRAVENOUS | Status: AC | PRN
  Administered 2015-07-15: 80 mL via INTRAVENOUS

## 2015-07-15 NOTE — Discharge Instructions (Signed)
Call and make appointment to follow-up with your primary physician. Return immediately for any worsening of your cough, increased blood in your cough, fever, difficulty breathing, lightheadedness or for any concerns. Hemoptysis Hemoptysis, which means coughing up blood, can be a sign of a minor problem or a serious medical condition. The blood that is coughed up may come from the lungs and airways. Coughed-up blood can also come from bleeding that occurs outside the lungs and airways. Blood can drain into the windpipe during a severe nosebleed or when blood is vomited from the stomach. Because hemoptysis can be a sign of something serious, a medical evaluation is required. For some people with hemoptysis, no definite cause is ever identified. CAUSES  The most common cause of hemoptysis is bronchitis. Some other common causes include:   A ruptured blood vessel caused by coughing or an infection.   A medical condition that causes damage to the large air passageways (bronchiectasis).   A blood clot in the lungs (pulmonary embolism).   Pneumonia.   Tuberculosis.   Breathing in a small foreign object.   Cancer. For some people with hemoptysis, no definite cause is ever identified.  HOME CARE INSTRUCTIONS  Only take over-the-counter or prescription medicines as directed by your caregiver. Do not use cough suppressants unless your caregiver approves.  If your caregiver prescribes antibiotic medicines, take them as directed. Finish them even if you start to feel better.  Do not smoke. Also avoid secondhand smoke.  Follow up with your caregiver as directed. SEEK IMMEDIATE MEDICAL CARE IF:   You cough up bloody mucus for longer than a week.  You have a blood-producing cough that is severe or getting worse.  You have a blood-producing cough thatcomes and goes over time.  You develop problems with your breathing.   You vomit blood.  You develop bloody or black-colored  stools.  You have chest pain.   You develop night sweats.  You feel faint or pass out.   You have a fever or persistent symptoms for more than 2-3 days.  You have a fever and your symptoms suddenly get worse. MAKE SURE YOU:  Understand these instructions.  Will watch your condition.  Will get help right away if you are not doing well or get worse.   This information is not intended to replace advice given to you by your health care provider. Make sure you discuss any questions you have with your health care provider.   Document Released: 06/18/2001 Document Revised: 03/26/2012 Document Reviewed: 01/25/2012 Elsevier Interactive Patient Education [employer]2016 Elsevier Inc.

## 2015-07-15 NOTE — ED Notes (Signed)
Pt reports coughing up blood tinged sputum x several days.  Denies pain.

## 2015-07-15 NOTE — ED Notes (Signed)
C/o coughing up bright red blood x 3 days  Denies pain, no sob, no chills or fevers

## 2015-07-15 NOTE — ED Provider Notes (Signed)
CSN: [id number]     Arrival date & time 07/15/15  1845 History  By signing my name below, I, Iona Beard, attest that this documentation has been prepared under the direction and in the presence of Loren Racer, MD.   Electronically Signed: Iona Beard, ED Scribe. 07/15/2015. 10:26 PM  Chief Complaint  Patient presents with  . Hemoptysis   The history is provided by the patient. No language interpreter was used.   HPI Comments: Donald Frazier is a 65 y.o. male who presents to the Emergency Department complaining of gradual onset, hemoptysis, onset two days ago. Pt reports the blood is bright and mixed with phlegm. No worsening or alleviating factors noted. Pt denies fever, shortness of breathe, generalized body aches, dizziness, light-headedness, chest pain, leg swelling, extended travel, consistent weight loss, or any other pertinent symptoms. Pt is a smoker about 1 pack/day x 60 years.   History reviewed. No pertinent past medical history. Past Surgical History  Procedure Laterality Date  . Back surgery    . Shoulder surgery Left   . Hemorrhoid surgery     Family History  Problem Relation Age of Onset  . Hypertension Mother   . Diabetes Mother   . Hypertension Father   . Diabetes Father    Social History  Substance Use Topics  . Smoking status: Current Every Day Smoker -- 1.00 packs/day for 60 years  . Smokeless tobacco: None  . Alcohol Use: Yes     Comment: 1 can beer a night.(no heavy drinking recently)    Review of Systems  Constitutional: Negative for fever and chills.  HENT: Negative for sore throat.   Respiratory: Positive for cough. Negative for shortness of breath.   Cardiovascular: Negative for chest pain and leg swelling.  Gastrointestinal: Negative for nausea, vomiting, abdominal pain and diarrhea.  Musculoskeletal: Negative for myalgias, back pain and neck pain.  Skin: Negative for rash and wound.  Neurological: Negative for dizziness,  weakness, light-headedness, numbness and headaches.  All other systems reviewed and are negative.  Allergies  Review of patient's allergies indicates no known allergies.  Home Medications   Prior to Admission medications   Medication Sig Start Date End Date Taking? Authorizing Provider  diclofenac (VOLTAREN) 75 MG EC tablet Take 1 tablet (75 mg total) by mouth 2 (two) times daily. 08/10/14   Ramon Dredge Saguier, PA-C  metroNIDAZOLE (FLAGYL) 500 MG tablet Take 1 tablet (500 mg total) by mouth 3 (three) times daily. 02/03/15   Edward Saguier, PA-C  ondansetron (ZOFRAN ODT) 8 MG disintegrating tablet Take 1 tablet (8 mg total) by mouth every 8 (eight) hours as needed for nausea or vomiting. 07/08/14   Ramon Dredge Saguier, PA-C  ranitidine (ZANTAC) 150 MG capsule Take 1 capsule (150 mg total) by mouth 2 (two) times daily. 07/08/14   Ramon Dredge Saguier, PA-C  traMADol (ULTRAM) 50 MG tablet Take 1 tablet (50 mg total) by mouth every 6 (six) hours as needed. 08/10/14   Edward Saguier, PA-C   BP 154/83 mmHg  Pulse 76  Temp(Src) 98.6 F (37 C) (Oral)  Resp 18  Ht  (1.88 m)  Wt 167 lb (75.751 kg)  BMI 21.43 kg/m2  SpO2 99% Physical Exam  Constitutional: He is oriented to person, place, and time. He appears well-developed and well-nourished. No distress.  HENT:  Head: Normocephalic and atraumatic.  Mouth/Throat: Oropharynx is clear and moist.  Eyes: EOM are normal. Pupils are equal, round, and reactive to light.  Neck: Normal range of motion.  Neck supple.  Cardiovascular: Normal rate and regular rhythm.   Pulmonary/Chest: Effort normal and breath sounds normal. No respiratory distress. He has no wheezes. He has no rales.  Abdominal: Soft. Bowel sounds are normal.  Musculoskeletal: Normal range of motion. He exhibits no edema or tenderness.  Neurological: He is alert and oriented to person, place, and time.  Skin: Skin is warm and dry. No rash noted. No erythema.  Psychiatric: He has a normal mood and  affect. His behavior is normal.  Nursing note and vitals reviewed.   ED Course  Procedures (including critical care time) DIAGNOSTIC STUDIES: Oxygen Saturation is 99% on RA, normal by my interpretation.    COORDINATION OF CARE: 8:11 PM-Discussed treatment plan which includes CXR, CBC with differential/platelet, and CMP with pt at bedside and pt agreed to plan.   Labs Review Labs Reviewed  CBC WITH DIFFERENTIAL/PLATELET - Abnormal; Notable for the following:    Hemoglobin 12.9 (*)    All other components within normal limits  COMPREHENSIVE METABOLIC PANEL - Abnormal; Notable for the following:    Glucose, Bld 156 (*)    Calcium 8.5 (*)    ALT 12 (*)    All other components within normal limits  ACID FAST SMEAR (AFB)  ACID FAST CULTURE WITH REFLEXED SENSITIVITIES  PROTIME-INR  APTT    Imaging Review Dg Chest 2 View  07/15/2015  CLINICAL DATA:  Hemoptysis for 3 days.  Cough.  Smoker. EXAM: CHEST  2 VIEW COMPARISON:  01/30/2006 FINDINGS: Vague focal nodular infiltrative process in the right upper lung measuring about 2 cm diameter. This was not present previously and could represent developing infiltration or pulmonary nodule. CT chest recommended for further evaluation. Mild emphysematous changes in the lungs. Normal heart size and pulmonary vascularity. No blunting of costophrenic angles. No pneumothorax. Mild scarring in the right apex. Degenerative changes in the spine and shoulders. Postoperative changes in the left shoulder. IMPRESSION: Vague nodular infiltration in the right upper lung. CT suggested to exclude neoplasm versus infiltrate. Electronically Signed   By: Burman Nieves M.D.   On: 07/15/2015 19:57   Ct Chest W Contrast  07/15/2015  CLINICAL DATA:  Coughing up bright red blood for 3 days. Nodularity on chest radiograph. EXAM: CT CHEST WITH CONTRAST TECHNIQUE: Multidetector CT imaging of the chest was performed during intravenous contrast administration. CONTRAST:  80mL  ISOVUE-300 IOPAMIDOL (ISOVUE-300) INJECTION 61% COMPARISON:  Chest radiograph 07/15/2015.  CT chest 05/24/2004 FINDINGS: Normal heart size. Normal caliber thoracic aorta. No aortic dissection. No central pulmonary embolus. Esophagus is decompressed. No significant lymphadenopathy in the chest. Emphysematous changes in the upper lungs. There is interstitial thickening and ground-glass infiltration in a patchy distribution in the right upper lung. The nodular soft tissue density material is demonstrated within right upper lung blebs and small bulla line. This could represent fungal superinfection within old lung cavities. Other inflammatory or infiltrative process is could also have this appearance. Could consider TB or other atypical infections. Sub cm nodule demonstrated in the left upper lung with additional tiny nodules in the 2-3 mm range demonstrated in both lungs. Appearances are likely inflammatory. Follow-up in 3 months is suggested to exclude persistent nodules. No pneumothorax. No pleural effusions. Included portions of the upper abdominal organs are grossly unremarkable. No destructive bone lesions. IMPRESSION: Emphysematous changes in the lungs. Interstitial thickening with patchy infiltration and nodular changes within emphysematous blebs in the right upper lung. Scattered additional lung nodules. Changes are likely inflammatory. Consider atypical infection such as  fungal superinfection or possibly TB. Suggest follow-up CT in 3 months to exclude persistent nodules. Electronically Signed   By: Burman NievesWilliam  Stevens M.D.   On: 07/15/2015 21:35   I have personally reviewed and evaluated these images and lab results as part of my medical decision-making.   EKG Interpretation None      MDM   Final diagnoses:  Hemoptysis    I personally performed the services described in this documentation, which was scribed in my presence. The recorded information has been reviewed and is accurate.    Continues  to be very well-appearing. Discussed CT findings with Dr. Orvan Falconerampbell, infectious disease. States it's unlikely TB but recommends AFB sputum cultures and follow-up with primary physician. Discussed with the patient and his son. Both understand the necessity of following up and likely repeat CT. Will start on Levaquin. Given first dose here. [facility]. He states his abdominal pain has improved. He's only taken one dose of antibiotics. Given improved. Blood cell counts and abdominal pain being completely resolved, recommend holding Flagyl and Cipro.  Patient understands the need to return immediately for any worsening hemoptysis, difficulty breathing or fever or any concerns.  Loren Raceravid Cheston Coury, MD 07/15/15 2230

## 2015-07-18 LAB — ACID FAST SMEAR (AFB): ACID FAST SMEAR - AFSCU2: NEGATIVE

## 2015-07-18 LAB — ACID FAST SMEAR (AFB, MYCOBACTERIA)

## 2015-08-30 LAB — ACID FAST CULTURE WITH REFLEXED SENSITIVITIES (MYCOBACTERIA)

## 2015-08-30 LAB — ACID FAST CULTURE WITH REFLEXED SENSITIVITIES: ACID FAST CULTURE - AFSCU3: NEGATIVE

## 2015-12-24 ENCOUNTER — Emergency Department (HOSPITAL_BASED_OUTPATIENT_CLINIC_OR_DEPARTMENT_OTHER)
Admission: EM | Admit: 2015-12-24 | Discharge: 2015-12-24 | Disposition: A | Discharge status: Home / Self Care | Payer: Medicare PPO | Attending: Emergency Medicine | Admitting: Emergency Medicine

## 2015-12-24 ENCOUNTER — Telehealth: Payer: Self-pay | Admitting: Medical

## 2015-12-24 ENCOUNTER — Encounter (HOSPITAL_BASED_OUTPATIENT_CLINIC_OR_DEPARTMENT_OTHER): Payer: Self-pay | Admitting: *Deleted

## 2015-12-24 DIAGNOSIS — F17200 Nicotine dependence, unspecified, uncomplicated: Secondary | ICD-10-CM | POA: Diagnosis not present

## 2015-12-24 DIAGNOSIS — M791 Myalgia, unspecified site: Secondary | ICD-10-CM

## 2015-12-24 DIAGNOSIS — M79651 Pain in right thigh: Secondary | ICD-10-CM | POA: Diagnosis not present

## 2015-12-24 DIAGNOSIS — M2550 Pain in unspecified joint: Secondary | ICD-10-CM

## 2015-12-24 DIAGNOSIS — M25551 Pain in right hip: Secondary | ICD-10-CM | POA: Insufficient documentation

## 2015-12-24 DIAGNOSIS — M25511 Pain in right shoulder: Secondary | ICD-10-CM | POA: Insufficient documentation

## 2015-12-24 MED ORDER — IBUPROFEN 400 MG PO TABS
600.0000 mg | ORAL_TABLET | Freq: Once | ORAL | Status: DC
  Filled 2015-12-24: qty 1

## 2015-12-24 MED ORDER — IBUPROFEN 600 MG PO TABS: 600.0000 mg | ORAL_TABLET | Freq: Three times a day (TID) | ORAL | 0 refills | Status: DC | PRN

## 2015-12-24 NOTE — ED Triage Notes (Signed)
Pt reports intermittent right arm and leg pain that is worse with strenuous activity. States that pain has been going on for "awhile" now maybe months.

## 2015-12-24 NOTE — ED Provider Notes (Signed)
[facility] Provider Note   CSN: [id number] Arrival date & time: 12/24/15  0007     History   Chief Complaint Chief Complaint  Patient presents with  . Leg Pain  . Arm Pain    HPI Donald Frazier is a 65 y.o. male past medical history of chronic pain in the right shoulder, right hip, and thigh presenting today with acute worsening of this pain. Patient states he has had pain in these areas for the past 3-4 years. He has not told his primary care doctor about this pain. He does not take any pain medication for this. He states he does not take any medications and he is a healthy person. The pain is worse when he is mowing the lawn or walking long distances. It starts in his posterior distal thigh and radiates up to his hip and into his lateral side. It is better with rest. He denies any significant trauma. There are no further complaints.  10 Systems reviewed and are negative for acute change except as noted in the HPI.    HPI  History reviewed. No pertinent past medical history.  Patient Active Problem List   Diagnosis Date Noted  . Pain in joint, shoulder region 08/10/2014  . Pain in the abdomen 07/08/2014    Past Surgical History:  Procedure Laterality Date  . BACK SURGERY    . HEMORRHOID SURGERY    . SHOULDER SURGERY Left        Home Medications    Prior to Admission medications   Not on File    Family History Family History  Problem Relation Age of Onset  . Hypertension Mother   . Diabetes Mother   . Hypertension Father   . Diabetes Father     Social History Social History  Substance Use Topics  . Smoking status: Current Every Day Smoker    Packs/day: 1.00    Years: 60.00  . Smokeless tobacco: Never Used  . Alcohol use Yes     Comment: 1 can beer a night.(no heavy drinking recently)     Allergies   Review of patient's allergies indicates no known allergies.   Review of Systems Review of Systems   Physical Exam Updated  Vital Signs BP 137/86 (BP Location: Left Arm)   Pulse 88   Temp 97.8 F (36.6 C) (Oral)   Resp 18   Ht 6\' 2"  (1.88 m)   Wt 177 lb (80.3 kg)   SpO2 94%   BMI 22.73 kg/m   Physical Exam  Constitutional: He is oriented to person, place, and time. Vital signs are normal. He appears well-developed and well-nourished.  Non-toxic appearance. He does not appear ill. No distress.  HENT:  Head: Normocephalic and atraumatic.  Nose: Nose normal.  Mouth/Throat: Oropharynx is clear and moist. No oropharyngeal exudate.  Eyes: Conjunctivae and EOM are normal. Pupils are equal, round, and reactive to light. No scleral icterus.  Neck: Normal range of motion. Neck supple. No tracheal deviation, no edema, no erythema and normal range of motion present. No thyroid mass and no thyromegaly present.  Cardiovascular: Normal rate, regular rhythm, S1 normal, S2 normal, normal heart sounds, intact distal pulses and normal pulses.  Exam reveals no gallop and no friction rub.   No murmur heard. Pulmonary/Chest: Effort normal and breath sounds normal. No respiratory distress. He has no wheezes. He has no rhonchi. He has no rales.  Abdominal: Soft. Normal appearance and bowel sounds are normal. He exhibits no distension,  no ascites and no mass. There is no hepatosplenomegaly. There is no tenderness. There is no rebound, no guarding and no CVA tenderness.  Musculoskeletal: Normal range of motion. He exhibits no edema, tenderness or deformity.  Normal examination of the right shoulder, right hip, and right knee. There is normal range of motion. No swelling or tenderness seen. Strong joint stability. Normal pulses and sensation distally.  Lymphadenopathy:    He has no cervical adenopathy.  Neurological: He is alert and oriented to person, place, and time. He has normal strength. No cranial nerve deficit or sensory deficit.  Skin: Skin is warm, dry and intact. No petechiae and no rash noted. He is not diaphoretic. No  erythema. No pallor.  Nursing note and vitals reviewed.    ED Treatments / Results  Labs (all labs ordered are listed, but only abnormal results are displayed) Labs Reviewed - No data to display  EKG  EKG Interpretation None       Radiology No results found.  Procedures Procedures (including critical care time)  Medications Ordered in ED Medications - No data to display   Initial Impression / Assessment and Plan / ED Course  I have reviewed the triage vital signs and the nursing notes.  Pertinent labs & imaging results that were available during my care of the patient were reviewed by me and considered in my medical decision making (see chart for details).  Clinical Course    Patient presents emergency department for acute on chronic pain of his right shoulder, right hip, and right thigh. He is advised on taking Tylenol or ibuprofen as needed. Advised on rest and ice packs. He is also advised to see his primary care physician for further care. He demonstrates good understanding. I do not believe x-rays are warranted as there is no history of trauma. He appears well in no acute distress, vital signs remain within his normal limits and he is safe for discharge.  He was given ibuprofen in the emergency department for pain control.  Final Clinical Impressions(s) / ED Diagnoses   Final diagnoses:  None    New Prescriptions New Prescriptions   No medications on file     Tomasita CrumbleAdeleke Shavelle Runkel, MD 12/24/15 [phone]

## 2015-12-24 NOTE — Telephone Encounter (Signed)
Pt seen in ED recently. Would you call pt and ask him to schedule follow up appointment with me. He has seen me 3 times only. I would like to address some health maintenance. Also would you check. Epic states no insurance. He should have medicare??. Want to know that situation since that would be barrier to health maintenance if he has to pay himself for maintenance?

## 2015-12-27 NOTE — Telephone Encounter (Signed)
Called patient and left a message for call back with his wife.

## 2016-01-02 ENCOUNTER — Other Ambulatory Visit: Payer: Self-pay

## 2016-01-02 ENCOUNTER — Telehealth: Payer: Self-pay

## 2016-01-02 NOTE — Telephone Encounter (Signed)
Called patient to schedule ER follow up appointment. Left message for return call.

## 2016-07-26 ENCOUNTER — Telehealth: Payer: Self-pay | Admitting: Medical

## 2016-07-26 NOTE — Telephone Encounter (Signed)
Left pt message asking to call Allison back directly at [phone] to schedule AWV. Thanks! °

## 2016-09-11 ENCOUNTER — Telehealth: Payer: Self-pay | Admitting: Medical

## 2016-09-11 DIAGNOSIS — R739 Hyperglycemia, unspecified: Secondary | ICD-10-CM

## 2016-09-11 NOTE — Telephone Encounter (Signed)
Patient Name: Donald Frazier  DOB: 06/22/1950    Initial Comment Caller states her husband has leg pain.   Nurse Assessment  Nurse: Leveda AnnaHensel, RN, Aeriel Date/Time (Eastern Time): 09/11/2016 10:02:30 AM  Confirm and document reason for call. If symptomatic, describe symptoms. ---Caller states, pt has been having leg pain off and on for the last couple of weeks she is not sure which leg.  Does the patient have any new or worsening symptoms? ---Yes  Will a triage be completed? ---No  Select reason for no triage. ---Other  Please document clinical information provided and list any resource used. ---Caller is not with pt and does not have a number to reach him at this time. Pt is having leg pain but she is unsure which leg and what is going on. Nurse advised caller to call us back when she is with him so we can triage his symptoms. Pt does already have an appt for next Wed.     Guidelines    Guideline Title Affirmed Question Affirmed Notes       Final Disposition User   Clinical Call Hensel, RN, Aeriel

## 2016-09-19 ENCOUNTER — Ambulatory Visit (HOSPITAL_BASED_OUTPATIENT_CLINIC_OR_DEPARTMENT_OTHER)
Admission: RE | Admit: 2016-09-19 | Discharge: 2016-09-19 | Disposition: A | Discharge status: Home / Self Care | Payer: Medicare PPO | Source: Ambulatory Visit | Attending: Medical | Admitting: Medical

## 2016-09-19 ENCOUNTER — Ambulatory Visit (INDEPENDENT_AMBULATORY_CARE_PROVIDER_SITE_OTHER): Payer: Medicare PPO | Admitting: Medical

## 2016-09-19 ENCOUNTER — Encounter: Payer: Self-pay | Admitting: Medical

## 2016-09-19 VITALS — BP 140/70 | HR 72 | Temp 97.9°F | Resp 16 | Ht 74.0 in | Wt 178.4 lb

## 2016-09-19 DIAGNOSIS — R5383 Other fatigue: Secondary | ICD-10-CM

## 2016-09-19 DIAGNOSIS — M47896 Other spondylosis, lumbar region: Secondary | ICD-10-CM | POA: Insufficient documentation

## 2016-09-19 DIAGNOSIS — G629 Polyneuropathy, unspecified: Secondary | ICD-10-CM

## 2016-09-19 DIAGNOSIS — R030 Elevated blood-pressure reading, without diagnosis of hypertension: Secondary | ICD-10-CM | POA: Diagnosis not present

## 2016-09-19 DIAGNOSIS — M545 Low back pain: Secondary | ICD-10-CM

## 2016-09-19 DIAGNOSIS — R252 Cramp and spasm: Secondary | ICD-10-CM

## 2016-09-19 LAB — COMPREHENSIVE METABOLIC PANEL
ALK PHOS: 95 U/L (ref 39–117)
ALT: 11 U/L (ref 0–53)
AST: 16 U/L (ref 0–37)
Albumin: 3.9 g/dL (ref 3.5–5.2)
BILIRUBIN TOTAL: 0.4 mg/dL (ref 0.2–1.2)
BUN: 15 mg/dL (ref 6–23)
CO2: 31 meq/L (ref 19–32)
Calcium: 9.6 mg/dL (ref 8.4–10.5)
Chloride: 102 mEq/L (ref 96–112)
Creatinine, Ser: 1.12 mg/dL (ref 0.40–1.50)
GFR: 83.77 mL/min (ref >60.00)
GLUCOSE: 110 mg/dL — AB (ref 70–99)
Potassium: 4.7 mEq/L (ref 3.5–5.1)
SODIUM: 136 meq/L (ref 135–145)
TOTAL PROTEIN: 7.6 g/dL (ref 6.0–8.3)

## 2016-09-19 LAB — CBC WITH DIFFERENTIAL/PLATELET
BASOS ABS: 0.1 10*3/uL (ref 0.0–0.1)
Basophils Relative: 0.7 % (ref 0.0–3.0)
Eosinophils Absolute: 0.2 10*3/uL (ref 0.0–0.7)
Eosinophils Relative: 2.3 % (ref 0.0–5.0)
HCT: 42.5 % (ref 39.0–52.0)
Hemoglobin: 14.1 g/dL (ref 13.0–17.0)
LYMPHS ABS: 1.2 10*3/uL (ref 0.7–4.0)
Lymphocytes Relative: 15.3 % (ref 12.0–46.0)
MCHC: 33.1 g/dL (ref 30.0–36.0)
MCV: 85.1 fl (ref 78.0–100.0)
MONOS PCT: 8.1 % (ref 3.0–12.0)
Monocytes Absolute: 0.7 10*3/uL (ref 0.1–1.0)
NEUTROS ABS: 5.9 10*3/uL (ref 1.4–7.7)
Neutrophils Relative %: 73.6 % (ref 43.0–77.0)
PLATELETS: 447 10*3/uL — AB (ref 150.0–400.0)
RBC: 4.99 Mil/uL (ref 4.22–5.81)
RDW: 15.5 % (ref 11.5–15.5)
WBC: 8 10*3/uL (ref 4.0–10.5)

## 2016-09-19 LAB — FOLATE: FOLATE: 10.6 ng/mL (ref >5.9)

## 2016-09-19 LAB — MAGNESIUM: MAGNESIUM: 2.4 mg/dL (ref 1.5–2.5)

## 2016-09-19 LAB — TSH: TSH: 1.27 u[IU]/mL (ref 0.35–4.50)

## 2016-09-19 LAB — VITAMIN B12: Vitamin B-12: 194 pg/mL — ABNORMAL LOW (ref 211–911)

## 2016-09-19 NOTE — Progress Notes (Signed)
Subjective:    Patient ID: Donald Frazier Height, male    DOB: 08-Feb-1951, 66 y.o.   MRN: [id number]  HPI  Pt in for evaluation of some numbness to both buttox down to both feet. He states tingling sensation. When he stands will occur. When he sits down and rest will subside 10-15 minutes.   Pt states had this in past but milder over the  years. But recently is a lot worse this past year.  Some occasional back pain with leg numbness per wife.  Pt also admits occasional get cramping to both calfs on and off. But not constant and not on walking.  Wife sometimes he complains of fatigue.      Review of Systems  Constitutional: Positive for fatigue. Negative for chills and fever.  HENT: Negative for dental problem.   Respiratory: Negative for cough, chest tightness, shortness of breath and wheezing.   Cardiovascular: Negative for chest pain and palpitations.  Gastrointestinal: Negative for abdominal pain, blood in stool, constipation, nausea and vomiting.  Endocrine: Negative for polydipsia, polyphagia and polyuria.  Genitourinary: Negative for difficulty urinating.  Musculoskeletal: Positive for back pain. Negative for myalgias and neck pain.       Cramps.  Skin: Negative for rash.  Neurological: Negative for dizziness, speech difficulty, weakness, numbness and headaches.       Neuropathy/tingling of his feet.  Hematological: Negative for adenopathy. Does not bruise/bleed easily.  Psychiatric/Behavioral: Negative for behavioral problems.    No past medical history on file.   Social History   Social History  . Marital status: Married    Spouse name: N/A  . Number of children: N/A  . Years of education: N/A   Occupational History  . Not on file.   Social History Main Topics  . Smoking status: Current Every Day Smoker    Packs/day: 1.00    Years: 60.00  . Smokeless tobacco: Never Used  . Alcohol use Yes     Comment: 1 can beer a night.(no heavy drinking recently)  .  Drug use: No  . Sexual activity: Not on file   Other Topics Concern  . Not on file   Social History Narrative  . No narrative on file    Past Surgical History:  Procedure Laterality Date  . BACK SURGERY    . HEMORRHOID SURGERY    . SHOULDER SURGERY Left     Family History  Problem Relation Age of Onset  . Hypertension Mother   . Diabetes Mother   . Hypertension Father   . Diabetes Father     No Known Allergies  No current outpatient prescriptions on file prior to visit.   No current facility-administered medications on file prior to visit.     BP (!) 143/75 (BP Location: Left Arm, Patient Position: Sitting, Cuff Size: Normal)   Pulse 72   Temp 97.9 F (36.6 C) (Oral)   Resp 16   Ht 6\' 2"  (1.88 m)   Wt 178 lb 6.4 oz (80.9 kg)   SpO2 97%   BMI 22.91 kg/m       Objective:   Physical Exam  General Appearance- Not in acute distress.    Chest and Lung Exam Auscultation: Breath sounds:-Normal. Clear even and unlabored. Adventitious sounds:- No Adventitious sounds.  Cardiovascular Auscultation:Rythm - Regular, rate and rythm. Heart Sounds -Normal heart sounds.  Abdomen Inspection:-Inspection Normal.  Palpation/Perucssion: Palpation and Percussion of the abdomen reveal- Non Tender, No Rebound tenderness, No rigidity(Guarding) and No Palpable abdominal  masses.  Liver:-Normal.  Spleen:- Normal.   Back No mid lumbar spine tenderness to palpation presently(but hx of intermittent) No pain on straight leg lift. No pain on lateral movements and flexion/extension of the spine.  Lower ext neurologic  L5-S1 sensation intact bilaterally. Normal patellar reflexes bilaterally. No foot drop bilaterally.  Lower ext- calves not swollen, symmetric. Negative homans signs. Pulses intact.      Assessment & Plan:  For neuropathy sensation reported will get b12, b1 and folate level.  For intermittent leg cramping will get cmp and mg level as well. If these test  negative and cramping worsens then consider vascular studies.  For back pain intermittent will get xray of lumbar spine to evaluate if neuropathy type pain may be radiating pain from lower lumbar spine.  For mild fatigue will get cbc and tsh.(metabolic panel will evaluate kidney function and liver function as well)  Evaluating source of symptoms. Currently not prescribing meds as symptoms don't appear severe and want to evaluate source of symptoms first.   Follow up in 3-4 weeks or as needed  Also on review of past bp levels and bp level today I want you to start checking bp at home every other day and document readings then on follow up bring readings in. Printed dash diet.  Aniylah Avans, Ramon DredgeEdward, PA-C

## 2016-09-19 NOTE — Patient Instructions (Addendum)
For neuropathy sensation reported will get b12, b1 and folate level.  For intermittent leg cramping will get cmp and mg level as well. If these test negative and cramping worsens then consider vascular studies.  For back pain intermittent will get xray of lumbar spine to evaluate if neuropathy type pain may be radiating pain from lower lumbar spine.  For mild fatigue will get cbc and tsh.(metabolic panel will evaluate kidney function and liver function as well)  Evaluating source of symptoms by work up today. Currently not prescribing meds as symptoms don't appear severe and want to evaluate source of symptoms first.   Follow up in 3-4 weeks or as needed  Also on review of past bp levels and bp level today I want you to start checking bp at home every other day and document readings then on follow up bring readings in. Printed dash diet.   DASH Eating Plan DASH stands for "Dietary Approaches to Stop Hypertension." The DASH eating plan is a healthy eating plan that has been shown to reduce high blood pressure (hypertension). It may also reduce your risk for type 2 diabetes, heart disease, and stroke. The DASH eating plan may also help with weight loss. What are tips for following this plan? General guidelines   Avoid eating more than 2,300 mg (milligrams) of salt (sodium) a day. If you have hypertension, you may need to reduce your sodium intake to 1,500 mg a day.  Limit alcohol intake to no more than 1 drink a day for nonpregnant women and 2 drinks a day for men. One drink equals 12 oz of beer, 5 oz of wine, or 1 oz of hard liquor.  Work with your health care provider to maintain a healthy body weight or to lose weight. Ask what an ideal weight is for you.  Get at least 30 minutes of exercise that causes your heart to beat faster (aerobic exercise) most days of the week. Activities may include walking, swimming, or biking.  Work with your health care provider or diet and nutrition  specialist (dietitian) to adjust your eating plan to your individual calorie needs. Reading food labels   Check food labels for the amount of sodium per serving. Choose foods with less than 5 percent of the Daily Value of sodium. Generally, foods with less than 300 mg of sodium per serving fit into this eating plan.  To find whole grains, look for the word "whole" as the first word in the ingredient list. Shopping   Buy products labeled as "low-sodium" or "no salt added."  Buy fresh foods. Avoid canned foods and premade or frozen meals. Cooking   Avoid adding salt when cooking. Use salt-free seasonings or herbs instead of table salt or sea salt. Check with your health care provider or pharmacist before using salt substitutes.  Do not fry foods. Cook foods using healthy methods such as baking, boiling, grilling, and broiling instead.  Cook with heart-healthy oils, such as olive, canola, soybean, or sunflower oil. Meal planning    Eat a balanced diet that includes:  5 or more servings of fruits and vegetables each day. At each meal, try to fill half of your plate with fruits and vegetables.  Up to 6-8 servings of whole grains each day.  Less than 6 oz of lean meat, poultry, or fish each day. A 3-oz serving of meat is about the same size as a deck of cards. One egg equals 1 oz.  2 servings of low-fat dairy each  day.  A serving of nuts, seeds, or beans 5 times each week.  Heart-healthy fats. Healthy fats called Omega-3 fatty acids are found in foods such as flaxseeds and coldwater fish, like sardines, salmon, and mackerel.  Limit how much you eat of the following:  Canned or prepackaged foods.  Food that is high in trans fat, such as fried foods.  Food that is high in saturated fat, such as fatty meat.  Sweets, desserts, sugary drinks, and other foods with added sugar.  Full-fat dairy products.  Do not salt foods before eating.  Try to eat at least 2 vegetarian meals each  week.  Eat more home-cooked food and less restaurant, buffet, and fast food.  When eating at a restaurant, ask that your food be prepared with less salt or no salt, if possible. What foods are recommended? The items listed may not be a complete list. Talk with your dietitian about what dietary choices are best for you. Grains  Whole-grain or whole-wheat bread. Whole-grain or whole-wheat pasta. Brown rice. Orpah Cobb. Bulgur. Whole-grain and low-sodium cereals. Pita bread. Low-fat, low-sodium crackers. Whole-wheat flour tortillas. Vegetables  Fresh or frozen vegetables (raw, steamed, roasted, or grilled). Low-sodium or reduced-sodium tomato and vegetable juice. Low-sodium or reduced-sodium tomato sauce and tomato paste. Low-sodium or reduced-sodium canned vegetables. Fruits  All fresh, dried, or frozen fruit. Canned fruit in natural juice (without added sugar). Meat and other protein foods  Skinless chicken or [place]. Ground chicken or [place]. Pork with fat trimmed off. Fish and seafood. Egg whites. Dried beans, peas, or lentils. Unsalted nuts, nut butters, and seeds. Unsalted canned beans. Lean cuts of beef with fat trimmed off. Low-sodium, lean deli meat. Dairy  Low-fat (1%) or fat-free (skim) milk. Fat-free, low-fat, or reduced-fat cheeses. Nonfat, low-sodium ricotta or cottage cheese. Low-fat or nonfat yogurt. Low-fat, low-sodium cheese. Fats and oils  Soft margarine without trans fats. Vegetable oil. Low-fat, reduced-fat, or light mayonnaise and salad dressings (reduced-sodium). Canola, safflower, olive, soybean, and sunflower oils. Avocado. Seasoning and other foods  Herbs. Spices. Seasoning mixes without salt. Unsalted popcorn and pretzels. Fat-free sweets. What foods are not recommended? The items listed may not be a complete list. Talk with your dietitian about what dietary choices are best for you. Grains  Baked goods made with fat, such as croissants, muffins, or some breads.  Dry pasta or rice meal packs. Vegetables  Creamed or fried vegetables. Vegetables in a cheese sauce. Regular canned vegetables (not low-sodium or reduced-sodium). Regular canned tomato sauce and paste (not low-sodium or reduced-sodium). Regular tomato and vegetable juice (not low-sodium or reduced-sodium). Rosita Fire. Olives. Fruits  Canned fruit in a light or heavy syrup. Fried fruit. Fruit in cream or butter sauce. Meat and other protein foods  Fatty cuts of meat. Ribs. Fried meat. Tomasa Blase. Sausage. Bologna and other processed lunch meats. Salami. Fatback. Hotdogs. Bratwurst. Salted nuts and seeds. Canned beans with added salt. Canned or smoked fish. Whole eggs or egg yolks. Chicken or [place] with skin. Dairy  Whole or 2% milk, cream, and half-and-half. Whole or full-fat cream cheese. Whole-fat or sweetened yogurt. Full-fat cheese. Nondairy creamers. Whipped toppings. Processed cheese and cheese spreads. Fats and oils  Butter. Stick margarine. Lard. Shortening. Ghee. Bacon fat. Tropical oils, such as coconut, palm kernel, or palm oil. Seasoning and other foods  Salted popcorn and pretzels. Onion salt, garlic salt, seasoned salt, table salt, and sea salt. Worcestershire sauce. Tartar sauce. Barbecue sauce. Teriyaki sauce. Soy sauce, including reduced-sodium. Steak sauce. Canned and  packaged gravies. Fish sauce. Oyster sauce. Cocktail sauce. Horseradish that you find on the shelf. Ketchup. Mustard. Meat flavorings and tenderizers. Bouillon cubes. Hot sauce and Tabasco sauce. Premade or packaged marinades. Premade or packaged taco seasonings. Relishes. Regular salad dressings. Where to find more information:  National Heart, Lung, and Blood Institute: PopSteam.iswww.nhlbi.nih.gov  American Heart Association: www.heart.org Summary  The DASH eating plan is a healthy eating plan that has been shown to reduce high blood pressure (hypertension). It may also reduce your risk for type 2 diabetes, heart disease, and  stroke.  With the DASH eating plan, you should limit salt (sodium) intake to 2,300 mg a day. If you have hypertension, you may need to reduce your sodium intake to 1,500 mg a day.  When on the DASH eating plan, aim to eat more fresh fruits and vegetables, whole grains, lean proteins, low-fat dairy, and heart-healthy fats.  Work with your health care provider or diet and nutrition specialist (dietitian) to adjust your eating plan to your individual calorie needs. This information is not intended to replace advice given to you by your health care provider. Make sure you discuss any questions you have with your health care provider. Document Released: 03/29/2011 Document Revised: 04/02/2016 Document Reviewed: 04/02/2016 Elsevier Interactive Patient Education  2017 [employer]

## 2016-09-19 NOTE — Telephone Encounter (Signed)
Future a1c placed. Will you add a1c to labs drawn yesterday.

## 2016-09-20 ENCOUNTER — Other Ambulatory Visit: Payer: Medicare PPO

## 2016-09-20 ENCOUNTER — Other Ambulatory Visit (INDEPENDENT_AMBULATORY_CARE_PROVIDER_SITE_OTHER): Payer: Medicare PPO

## 2016-09-20 DIAGNOSIS — R739 Hyperglycemia, unspecified: Secondary | ICD-10-CM

## 2016-09-20 LAB — HEMOGLOBIN A1C: HEMOGLOBIN A1C: 6.9 % — AB (ref 4.6–6.5)

## 2016-09-20 NOTE — Telephone Encounter (Signed)
Add on [facility] A[facility]. K[facility]

## 2016-09-21 ENCOUNTER — Telehealth: Payer: Self-pay | Admitting: Medical

## 2016-09-21 MED ORDER — GABAPENTIN 100 MG PO CAPS: 100.0000 mg | ORAL_CAPSULE | Freq: Every day | ORAL | 0 refills | Status: DC

## 2016-09-21 MED ORDER — METFORMIN HCL ER 500 MG PO TB24: 500.0000 mg | ORAL_TABLET | Freq: Every day | ORAL | 2 refills | Status: DC

## 2016-09-21 NOTE — Telephone Encounter (Signed)
Notified pt's spouse. 

## 2016-09-21 NOTE — Telephone Encounter (Signed)
Caller name:Stokke,Shirley A Relation to pt: spouse  Call back number: [phone]    Reason for call:  Spouse inquiring about lab results

## 2016-09-21 NOTE — Telephone Encounter (Signed)
Gabapentin sent to his pharmacy. One tablet at night to start out with. Can make him drowsy. Next month might rx at twice a day.

## 2016-09-21 NOTE — Telephone Encounter (Signed)
rx metformin sent to his pharmacy.

## 2016-09-23 LAB — VITAMIN B1: Vitamin B1 (Thiamine): 12 nmol/L (ref 8–30)

## 2016-09-26 NOTE — Telephone Encounter (Signed)
Scheduled 10/02/16 °

## 2016-09-27 ENCOUNTER — Telehealth: Payer: Self-pay | Admitting: Medical

## 2016-10-01 NOTE — Progress Notes (Addendum)
Subjective:   Donald Frazier is a 66 y.o. male who presents for an Initial Medicare Annual Wellness Visit.  The Patient was informed that the wellness visit is to identify future health risk and educate and initiate measures that can reduce risk for increased disease through the lifespan.   Describes health as fair, good or great? Pretty good!   Review of Systems  No ROS.  Medicare Wellness Visit.  Cardiac Risk Factors include: advanced age (>6255men, 22>65 women);male gender Sleep patterns:  Sleeps about 8 hrs. No naps. Feels rested. Home Safety/Smoke Alarms: Feels safe in home. No smoke alarms in place.  Living environment; residence and Firearm Safety: Lives with wife and son. Stairs have railing. Guns safely stored. Seat Belt Safety/Bike Helmet: Wears seat belt.   Counseling:   Eye Exam- Wears glasses. Pt states he will schedule eye exam. Dental- No dentist. Resource list provided.  Male:   [facility]-   Pt states he one in 2009 and that it was normal. PSA- No results found for: PSA     Objective:    Today's Vitals   10/02/16 0959  BP: 138/64  Pulse: 62  SpO2: 96%  Weight: 176 lb (79.8 kg)  Height: 6\' 2"  (1.88 m)   Body mass index is 22.6 kg/m.  Current Medications (verified) Outpatient Encounter Prescriptions as of 10/02/2016  Medication Sig  . gabapentin (NEURONTIN) 100 MG capsule Take 1 capsule (100 mg total) by mouth at bedtime.  . metFORMIN (GLUCOPHAGE-XR) 500 MG 24 hr tablet Take 1 tablet (500 mg total) by mouth daily with breakfast.  . [EXPIRED] cyanocobalamin ((VITAMIN B-12)) injection 1,000 mcg    No facility-administered encounter medications on file as of 10/02/2016.     Allergies (verified) Patient has no known allergies.   History: History reviewed. No pertinent past medical history. Past Surgical History:  Procedure Laterality Date  . BACK SURGERY    . HEMORRHOID SURGERY    . SHOULDER SURGERY Left    Family History  Problem Relation Age of  Onset  . Hypertension Mother   . Diabetes Mother   . Hypertension Father   . Diabetes Father    Social History   Occupational History  . Not on file.   Social History Main Topics  . Smoking status: Current Every Day Smoker    Packs/day: 1.00    Years: 60.00  . Smokeless tobacco: Never Used  . Alcohol use Yes     Comment: 1 can beer a night.(no heavy drinking recently)  . Drug use: No  . Sexual activity: No   Tobacco Counseling Ready to quit: Yes Counseling given: Yes   Activities of Daily Living In your present state of health, do you have any difficulty performing the following activities: 10/02/2016  Hearing? N  Vision? N  Difficulty concentrating or making decisions? N  Walking or climbing stairs? Y  Dressing or bathing? N  Doing errands, shopping? N  Preparing Food and eating ? N  Using the Toilet? N  In the past six months, have you accidently leaked urine? N  Do you have problems with loss of bowel control? N  Managing your Medications? N  Managing your Finances? N  Housekeeping or managing your Housekeeping? N  Some recent data might be hidden    Immunizations and Health Maintenance Immunization History  Administered Date(s) Administered  . Influenza,inj,Quad PF,36+ Mos 01/28/2015   Health Maintenance Due  Topic Date Due  . Hepatitis C Screening  May 05, 1950  . TETANUS/TDAP  05/29/1967  . COLONOSCOPY  05/28/1998  . PNA vac Low Risk Adult (1 of 2 - PCV13) 05/28/2013    Patient Care Team: Saguier, Kateri Mc as PCP - General (Physician Assistant)  Indicate any recent Medical Services you may have received from other than [facility] providers in the past year (date may be approximate).    Assessment:   This is a routine wellness examination for Donald Frazier. No ROS.  Medicare Wellness Visit.  Hearing/Vision screen  Visual Acuity Screening   Right eye Left eye Both eyes  Without correction:     With correction: 20/25 20/25 20/20   Comments: Wears glasses. No  issues reported. Pt states he will schedule eye exam soon.  Hearing Screening Comments: Able to hear conversational tones w/o difficulty. No issues reported.   Passes whisper test.   Dietary issues and exercise activities discussed: Current Exercise Habits: The patient does not participate in regular exercise at present   Diet (meal preparation, eat out, water intake, caffeinated beverages, dairy products, fruits and vegetables): in general, an "unhealthy" diet, high fat/ cholesterol Breakfast: skips Lunch: hot dog Dinner:  Beef and rice  Drinks juice and soda.  Goals      Patient Stated   . Drink at least 2 bottles of water per day. (pt-stated)      Depression Screen PHQ 2/9 Scores 10/02/2016 01/28/2015  PHQ - 2 Score 0 0    Fall Risk Fall Risk  10/02/2016 01/28/2015  Falls in the past year? No No    Cognitive Function: MMSE - Mini Mental State Exam 10/02/2016  Orientation to time 5  Orientation to Place 5  Registration 3  Attention/ Calculation 4  Recall 2  Language- name 2 objects 2  Language- repeat 0  Language- follow 3 step command 3  Language- read & follow direction 1  Write a sentence 1  Copy design 1  Total score 27        Screening Tests Health Maintenance  Topic Date Due  . Hepatitis C Screening  October 20, 1950  . TETANUS/TDAP  05/29/1967  . COLONOSCOPY  05/28/1998  . PNA vac Low Risk Adult (1 of 2 - PCV13) 05/28/2013  . INFLUENZA VACCINE  11/21/2016        Plan:     Follow up with PCP as directed.  Return for B12 injection in 1 month.  Eat heart healthy diet (full of fruits, vegetables, whole grains, lean protein, water--limit salt, fat, and sugar intake) and increase physical activity as tolerated.  Continue doing brain stimulating activities (puzzles, reading, adult coloring books, staying active) to keep memory sharp.   Bring a copy of your advance directives to your next office visit.  Schedule eye exam.   I have personally reviewed  and noted the following in the patient's chart:   . Medical and social history . Use of alcohol, tobacco or illicit drugs  . Current medications and supplements . Functional ability and status . Nutritional status . Physical activity . Advanced directives . List of other physicians . Hospitalizations, surgeries, and ER visits in previous 12 months . Vitals . Screenings to include cognitive, depression, and falls . Referrals and appointments  In addition, I have reviewed and discussed with patient certain preventive protocols, quality metrics, and best practice recommendations. A written personalized care plan for preventive services as well as general preventive health recommendations were provided to patient.     [place]on [place], [place]   10/02/2016     Medical screening examination was  performed by Health Coach and as supervising physician I was immediately available for consultation/collaboration. I have reviewed documentation and agree with assessment and plan.  Danise Edge, MD

## 2016-10-02 ENCOUNTER — Ambulatory Visit (INDEPENDENT_AMBULATORY_CARE_PROVIDER_SITE_OTHER): Payer: Medicare PPO | Admitting: *Deleted

## 2016-10-02 ENCOUNTER — Ambulatory Visit: Payer: Medicare PPO

## 2016-10-02 ENCOUNTER — Encounter: Payer: Self-pay | Admitting: *Deleted

## 2016-10-02 VITALS — BP 138/64 | HR 62 | Ht 74.0 in | Wt 176.0 lb

## 2016-10-02 DIAGNOSIS — Z0000 Encounter for general adult medical examination without abnormal findings: Secondary | ICD-10-CM | POA: Diagnosis not present

## 2016-10-02 DIAGNOSIS — E538 Deficiency of other specified B group vitamins: Secondary | ICD-10-CM | POA: Diagnosis not present

## 2016-10-02 MED ORDER — CYANOCOBALAMIN 1000 MCG/ML IJ SOLN
1000.0000 ug | Freq: Once | INTRAMUSCULAR | Status: AC
  Administered 2016-10-02: 1000 ug via INTRAMUSCULAR

## 2016-10-02 NOTE — Patient Instructions (Signed)
Donald Frazier , Thank you for taking time to come for your Medicare Wellness Visit. I appreciate your ongoing commitment to your health goals. Please review the following plan we discussed and let me know if I can assist you in the future.   These are the goals we discussed: Goals      Patient Stated   . Drink at least 2 bottles of water per day. (pt-stated)       This is a list of the screening recommended for you and due dates:  Health Maintenance  Topic Date Due  .  Hepatitis C: One time screening is recommended by Center for Disease Control  (CDC) for  adults born from [birth year] through 1967.   03/30/1951  . Tetanus Vaccine  05/29/1967  . Colon Cancer Screening  05/28/1998  . Pneumonia vaccines (1 of 2 - PCV13) 05/28/2013  . Flu Shot  11/21/2016   Return for B12 injection in 1 month.  Eat heart healthy diet (full of fruits, vegetables, whole grains, lean protein, water--limit salt, fat, and sugar intake) and increase physical activity as tolerated.  Continue doing brain stimulating activities (puzzles, reading, adult coloring books, staying active) to keep memory sharp.   Bring a copy of your advance directives to your next office visit.  Schedule eye exam.    Health Maintenance, Male A healthy lifestyle and preventive care is important for your health and wellness. Ask your health care provider about what schedule of regular examinations is right for you. What should I know about weight and diet? Eat a Healthy Diet  Eat plenty of vegetables, fruits, whole grains, low-fat dairy products, and lean protein.  Do not eat a lot of foods high in solid fats, added sugars, or salt.  Maintain a Healthy Weight Regular exercise can help you achieve or maintain a healthy weight. You should:  Do at least 150 minutes of exercise each week. The exercise should increase your heart rate and make you sweat (moderate-intensity exercise).  Do strength-training exercises at least twice a  week.  Watch Your Levels of Cholesterol and Blood Lipids  Have your blood tested for lipids and cholesterol every 5 years starting at 69 years of age. If you are at high risk for heart disease, you should start having your blood tested when you are 68 years old. You may need to have your cholesterol levels checked more often if: ? Your lipid or cholesterol levels are high. ? You are older than 68 years of age. ? You are at high risk for heart disease.  What should I know about cancer screening? Many types of cancers can be detected early and may often be prevented. Lung Cancer  You should be screened every year for lung cancer if: ? You are a current smoker who has smoked for at least 30 years. ? You are a former smoker who has quit within the past 15 years.  Talk to your health care provider about your screening options, when you should start screening, and how often you should be screened.  Colorectal Cancer  Routine colorectal cancer screening usually begins at 68 years of age and should be repeated every 5-10 years until you are 68 years old. You may need to be screened more often if early forms of precancerous polyps or small growths are found. Your health care provider may recommend screening at an earlier age if you have risk factors for colon cancer.  Your health care provider may recommend using home test kits  to check for hidden blood in the stool.  A small camera at the end of a tube can be used to examine your colon (sigmoidoscopy or colonoscopy). This checks for the earliest forms of colorectal cancer.  Prostate and Testicular Cancer  Depending on your age and overall health, your health care provider may do certain tests to screen for prostate and testicular cancer.  Talk to your health care provider about any symptoms or concerns you have about testicular or prostate cancer.  Skin Cancer  Check your skin from head to toe regularly.  Tell your health care provider  about any new moles or changes in moles, especially if: ? There is a change in a mole's size, shape, or color. ? You have a mole that is larger than a pencil eraser.  Always use sunscreen. Apply sunscreen liberally and repeat throughout the day.  Protect yourself by wearing long sleeves, pants, a wide-brimmed hat, and sunglasses when outside.  What should I know about heart disease, diabetes, and high blood pressure?  If you are 318-68 years of age, have your blood pressure checked every 3-5 years. If you are 68 years of age or older, have your blood pressure checked every year. You should have your blood pressure measured twice-once when you are at a hospital or clinic, and once when you are not at a hospital or clinic. Record the average of the two measurements. To check your blood pressure when you are not at a hospital or clinic, you can use: ? An automated blood pressure machine at a pharmacy. ? A home blood pressure monitor.  Talk to your health care provider about your target blood pressure.  If you are between 7345-68 years old, ask your health care provider if you should take aspirin to prevent heart disease.  Have regular diabetes screenings by checking your fasting blood sugar level. ? If you are at a normal weight and have a low risk for diabetes, have this test once every three years after the age of 68. ? If you are overweight and have a high risk for diabetes, consider being tested at a younger age or more often.  A one-time screening for abdominal aortic aneurysm (AAA) by ultrasound is recommended for men aged 65-75 years who are current or former smokers. What should I know about preventing infection? Hepatitis B If you have a higher risk for hepatitis B, you should be screened for this virus. Talk with your health care provider to find out if you are at risk for hepatitis B infection. Hepatitis C Blood testing is recommended for:  Everyone born from [id number] through  1965.  Anyone with known risk factors for hepatitis C.  Sexually Transmitted Diseases (STDs)  You should be screened each year for STDs including gonorrhea and chlamydia if: ? You are sexually active and are younger than 68 years of age. ? You are older than 68 years of age and your health care provider tells you that you are at risk for this type of infection. ? Your sexual activity has changed since you were last screened and you are at an increased risk for chlamydia or gonorrhea. Ask your health care provider if you are at risk.  Talk with your health care provider about whether you are at high risk of being infected with HIV. Your health care provider may recommend a prescription medicine to help prevent HIV infection.  What else can I do?  Schedule regular health, dental, and eye exams.  Stay  current with your vaccines (immunizations).  Do not use any tobacco products, such as cigarettes, chewing tobacco, and e-cigarettes. If you need help quitting, ask your health care provider.  Limit alcohol intake to no more than 2 drinks per day. One drink equals 12 ounces of beer, 5 ounces of wine, or 1 ounces of hard liquor.  Do not use street drugs.  Do not share needles.  Ask your health care provider for help if you need support or information about quitting drugs.  Tell your health care provider if you often feel depressed.  Tell your health care provider if you have ever been abused or do not feel safe at home. This information is not intended to replace advice given to you by your health care provider. Make sure you discuss any questions you have with your health care provider. Document Released: 10/06/2007 Document Revised: 12/07/2015 Document Reviewed: 01/11/2015 Elsevier Interactive Patient Education  2018 [employer].  DASH Eating Plan DASH stands for "Dietary Approaches to Stop Hypertension." The DASH eating plan is a healthy eating plan that has been shown to reduce  high blood pressure (hypertension). It may also reduce your risk for type 2 diabetes, heart disease, and stroke. The DASH eating plan may also help with weight loss. What are tips for following this plan? General guidelines  Avoid eating more than 2,300 mg (milligrams) of salt (sodium) a day. If you have hypertension, you may need to reduce your sodium intake to 1,500 mg a day.  Limit alcohol intake to no more than 1 drink a day for nonpregnant women and 2 drinks a day for men. One drink equals 12 oz of beer, 5 oz of wine, or 1 oz of hard liquor.  Work with your health care provider to maintain a healthy body weight or to lose weight. Ask what an ideal weight is for you.  Get at least 30 minutes of exercise that causes your heart to beat faster (aerobic exercise) most days of the week. Activities may include walking, swimming, or biking.  Work with your health care provider or diet and nutrition specialist (dietitian) to adjust your eating plan to your individual calorie needs. Reading food labels  Check food labels for the amount of sodium per serving. Choose foods with less than 5 percent of the Daily Value of sodium. Generally, foods with less than 300 mg of sodium per serving fit into this eating plan.  To find whole grains, look for the word "whole" as the first word in the ingredient list. Shopping  Buy products labeled as "low-sodium" or "no salt added."  Buy fresh foods. Avoid canned foods and premade or frozen meals. Cooking  Avoid adding salt when cooking. Use salt-free seasonings or herbs instead of table salt or sea salt. Check with your health care provider or pharmacist before using salt substitutes.  Do not fry foods. Cook foods using healthy methods such as baking, boiling, grilling, and broiling instead.  Cook with heart-healthy oils, such as olive, canola, soybean, or sunflower oil. Meal planning   Eat a balanced diet that includes: ? 5 or more servings of fruits  and vegetables each day. At each meal, try to fill half of your plate with fruits and vegetables. ? Up to 6-8 servings of whole grains each day. ? Less than 6 oz of lean meat, poultry, or fish each day. A 3-oz serving of meat is about the same size as a deck of cards. One egg equals 1 oz. ? 2  servings of low-fat dairy each day. ? A serving of nuts, seeds, or beans 5 times each week. ? Heart-healthy fats. Healthy fats called Omega-3 fatty acids are found in foods such as flaxseeds and coldwater fish, like sardines, salmon, and mackerel.  Limit how much you eat of the following: ? Canned or prepackaged foods. ? Food that is high in trans fat, such as fried foods. ? Food that is high in saturated fat, such as fatty meat. ? Sweets, desserts, sugary drinks, and other foods with added sugar. ? Full-fat dairy products.  Do not salt foods before eating.  Try to eat at least 2 vegetarian meals each week.  Eat more home-cooked food and less restaurant, buffet, and fast food.  When eating at a restaurant, ask that your food be prepared with less salt or no salt, if possible. What foods are recommended? The items listed may not be a complete list. Talk with your dietitian about what dietary choices are best for you. Grains Whole-grain or whole-wheat bread. Whole-grain or whole-wheat pasta. Brown rice. Orpah Cobb. Bulgur. Whole-grain and low-sodium cereals. Pita bread. Low-fat, low-sodium crackers. Whole-wheat flour tortillas. Vegetables Fresh or frozen vegetables (raw, steamed, roasted, or grilled). Low-sodium or reduced-sodium tomato and vegetable juice. Low-sodium or reduced-sodium tomato sauce and tomato paste. Low-sodium or reduced-sodium canned vegetables. Fruits All fresh, dried, or frozen fruit. Canned fruit in natural juice (without added sugar). Meat and other protein foods Skinless chicken or [place]. Ground chicken or [place]. Pork with fat trimmed off. Fish and seafood. Egg whites.  Dried beans, peas, or lentils. Unsalted nuts, nut butters, and seeds. Unsalted canned beans. Lean cuts of beef with fat trimmed off. Low-sodium, lean deli meat. Dairy Low-fat (1%) or fat-free (skim) milk. Fat-free, low-fat, or reduced-fat cheeses. Nonfat, low-sodium ricotta or cottage cheese. Low-fat or nonfat yogurt. Low-fat, low-sodium cheese. Fats and oils Soft margarine without trans fats. Vegetable oil. Low-fat, reduced-fat, or light mayonnaise and salad dressings (reduced-sodium). Canola, safflower, olive, soybean, and sunflower oils. Avocado. Seasoning and other foods Herbs. Spices. Seasoning mixes without salt. Unsalted popcorn and pretzels. Fat-free sweets. What foods are not recommended? The items listed may not be a complete list. Talk with your dietitian about what dietary choices are best for you. Grains Baked goods made with fat, such as croissants, muffins, or some breads. Dry pasta or rice meal packs. Vegetables Creamed or fried vegetables. Vegetables in a cheese sauce. Regular canned vegetables (not low-sodium or reduced-sodium). Regular canned tomato sauce and paste (not low-sodium or reduced-sodium). Regular tomato and vegetable juice (not low-sodium or reduced-sodium). Rosita Fire. Olives. Fruits Canned fruit in a light or heavy syrup. Fried fruit. Fruit in cream or butter sauce. Meat and other protein foods Fatty cuts of meat. Ribs. Fried meat. Tomasa Blase. Sausage. Bologna and other processed lunch meats. Salami. Fatback. Hotdogs. Bratwurst. Salted nuts and seeds. Canned beans with added salt. Canned or smoked fish. Whole eggs or egg yolks. Chicken or [place] with skin. Dairy Whole or 2% milk, cream, and half-and-half. Whole or full-fat cream cheese. Whole-fat or sweetened yogurt. Full-fat cheese. Nondairy creamers. Whipped toppings. Processed cheese and cheese spreads. Fats and oils Butter. Stick margarine. Lard. Shortening. Ghee. Bacon fat. Tropical oils, such as coconut, palm  kernel, or palm oil. Seasoning and other foods Salted popcorn and pretzels. Onion salt, garlic salt, seasoned salt, table salt, and sea salt. Worcestershire sauce. Tartar sauce. Barbecue sauce. Teriyaki sauce. Soy sauce, including reduced-sodium. Steak sauce. Canned and packaged gravies. Fish sauce. Oyster sauce. Cocktail sauce. Horseradish  that you find on the shelf. Ketchup. Mustard. Meat flavorings and tenderizers. Bouillon cubes. Hot sauce and Tabasco sauce. Premade or packaged marinades. Premade or packaged taco seasonings. Relishes. Regular salad dressings. Where to find more information:  National Heart, Lung, and Blood Institute: PopSteam.is  American Heart Association: www.heart.org Summary  The DASH eating plan is a healthy eating plan that has been shown to reduce high blood pressure (hypertension). It may also reduce your risk for type 2 diabetes, heart disease, and stroke.  With the DASH eating plan, you should limit salt (sodium) intake to 2,300 mg a day. If you have hypertension, you may need to reduce your sodium intake to 1,500 mg a day.  When on the DASH eating plan, aim to eat more fresh fruits and vegetables, whole grains, lean proteins, low-fat dairy, and heart-healthy fats.  Work with your health care provider or diet and nutrition specialist (dietitian) to adjust your eating plan to your individual calorie needs. This information is not intended to replace advice given to you by your health care provider. Make sure you discuss any questions you have with your health care provider. Document Released: 03/29/2011 Document Revised: 04/02/2016 Document Reviewed: 04/02/2016 Elsevier Interactive Patient Education  2017 [employer].

## 2016-10-19 ENCOUNTER — Telehealth: Payer: Self-pay | Admitting: Medical

## 2016-10-19 NOTE — Telephone Encounter (Signed)
Caller name:Mrs Erich Relationship to patient: Can be reached:[phone] Pharmacy:  Reason for call:Requesting results of xray

## 2016-10-23 ENCOUNTER — Other Ambulatory Visit: Payer: Self-pay | Admitting: Medical

## 2016-10-23 NOTE — Telephone Encounter (Signed)
Left pt's spouse a message to call back.

## 2016-10-23 NOTE — Telephone Encounter (Signed)
Spoke with pt. Pt states that legs are getting tired and giving out on him.

## 2016-10-23 NOTE — Telephone Encounter (Signed)
Caller name:Mrs Musa Relationship to patient: Can be [phone]  Pt wife called requesting call back for results of xray 09/19/16 lumbar spine. [facility] and apologizes.

## 2016-10-23 NOTE — Telephone Encounter (Signed)
Will you call pt and offer him appointment early afternoon with me on Thursday or Friday.for his leg weakness

## 2016-10-25 ENCOUNTER — Telehealth: Payer: Self-pay | Admitting: Medical

## 2016-10-25 MED ORDER — GABAPENTIN 100 MG PO CAPS: 100.0000 mg | ORAL_CAPSULE | Freq: Every day | ORAL | 3 refills | Status: DC

## 2016-10-25 NOTE — Telephone Encounter (Signed)
Refilled his neurontin. Notify pt.

## 2016-10-25 NOTE — Telephone Encounter (Signed)
Would you like to refill? 

## 2016-10-25 NOTE — Telephone Encounter (Signed)
Schedule appointment 10/30/16

## 2016-10-26 NOTE — Telephone Encounter (Signed)
Called patient.  Unable to reach him.  Left a message on voicemail making him aware that [facility]

## 2016-10-30 ENCOUNTER — Ambulatory Visit (INDEPENDENT_AMBULATORY_CARE_PROVIDER_SITE_OTHER): Payer: Medicare PPO | Admitting: Medical

## 2016-10-30 VITALS — BP 135/66 | HR 66 | Temp 97.7°F | Resp 16 | Ht 74.0 in | Wt 178.0 lb

## 2016-10-30 DIAGNOSIS — E119 Type 2 diabetes mellitus without complications: Secondary | ICD-10-CM | POA: Diagnosis not present

## 2016-10-30 DIAGNOSIS — G629 Polyneuropathy, unspecified: Secondary | ICD-10-CM | POA: Diagnosis not present

## 2016-10-30 DIAGNOSIS — R29898 Other symptoms and signs involving the musculoskeletal system: Secondary | ICD-10-CM

## 2016-10-30 DIAGNOSIS — E538 Deficiency of other specified B group vitamins: Secondary | ICD-10-CM

## 2016-10-30 MED ORDER — CYANOCOBALAMIN 1000 MCG/ML IJ SOLN
1000.0000 ug | Freq: Once | INTRAMUSCULAR | Status: AC
  Administered 2016-10-30: 1000 ug via INTRAMUSCULAR

## 2016-10-30 MED ORDER — GABAPENTIN 100 MG PO CAPS: 100.0000 mg | ORAL_CAPSULE | Freq: Three times a day (TID) | ORAL | 0 refills | Status: DC

## 2016-10-30 NOTE — Progress Notes (Signed)
Subjective:    Patient ID: Donald Frazier, male    DOB: [birth year], 66 y.o.   MRN: [id number]  HPI  Pt in for follow up.   Pt had some fatigue on last visit. Work up showed b12 low. Pt has gotten 1 injection already. Due for another. Energy level mild improved.  Pt states pain in both legs and numbness. Feels like pins. Pt is diabetic. He is not taken neurontin presently. He though just one tablet did not help much. Had plans to advance the dose. Not major lethargy with med. He did take at night so wife speculates if it made him drowsy.  Pt is on metformin twice daily.   Back xray done on last visit based on prior complaints. See office note. Mild degenerative changes. No severe back pain recently. Only mild recently.  Calfs are not cramping. Leg just feel tired. Electrolytes and mg level was normal on last visit.Leg weakness after walking short distance. Approximate 50 feet. Also tingles and needs to rest. Desires placard.   Review of Systems  Constitutional: Negative for chills and fatigue.  Respiratory: Negative for cough, chest tightness, shortness of breath and wheezing.   Cardiovascular: Negative for chest pain and palpitations.  Gastrointestinal: Negative for abdominal pain, diarrhea, nausea and vomiting.       2 weeks ago brief one day rt lower quadrant pain. One day then resolved. No pain presently.   Musculoskeletal: Negative for back pain.       Leg weakness. Tingling. Neuropathy.  No rt hip pain.  Back has been mild painful at times but not constant and no defintite radicular pain presently.  Skin: Negative for rash.  Neurological: Positive for weakness. Negative for dizziness, speech difficulty and headaches.       Of legs with activity. Neuropathy. Tingling pain.  Hematological: Negative for adenopathy. Does not bruise/bleed easily.  Psychiatric/Behavioral: Negative for behavioral problems and confusion.       Objective:   Physical Exam   General Mental  Status- Alert. General Appearance- Not in acute distress.   Skin General: Color- Normal Color. Moisture- Normal Moisture.  Neck Carotid Arteries- Normal color. Moisture- Normal Moisture. No carotid bruits. No JVD.  Chest and Lung Exam Auscultation: Breath Sounds:-Normal.  Cardiovascular Auscultation:Rythm- Regular. Murmurs & Other Heart Sounds:Auscultation of the heart reveals- No Murmurs.  Abdomen Inspection:-Inspeection Normal. Palpation/Percussion:Note:No mass. Palpation and Percussion of the abdomen reveal- Non Tender, Non Distended + BS, no rebound or guarding. No heal jar pain.   Neurologic Cranial Nerve exam:- CN III-XII intact(No nystagmus), symmetric smile. Strength:- 5/5 equal and symmetric strength both upper and lower extremities.  Lower ext- feet no ulcers nor lesion. No breakdown. See quality. No hair on feet(never had in past)normal temp on palpation. Normal capillary refill.      Assessment & Plan:  For your neuropathy/nerve type pain I will rx neurontin again. Official instruction will be 1 tab 3 times daily. But start with one tab for 4 days then advance to twice daily for 4 days. Then advance to 3 times daily. If not working or oversedation then could try other medication.  Part of fatigue and neuropathy may be related to low b12. Second im injection b12 today. b12 vitamin in one month.  Over next 2-3 weeks I want you to note if leg weakness or nerve pain associated with back pain. As might consider mri of lumber spine or ABI(vascular studies if symptoms persist).(but also might consider waiting and making sure b12 at  good level before expanding work up)  Continue metformin for diabetes.  Placard form filled. Disabilty.(qualifyer neuropathy and leg weakness. Can walk 200 ft without resting per pt) In fact less distance.  Follow up December 21, 2016 or as needed  Esperanza RichtersSaguier, Alessio Bogan, [place] JerseyPA-C

## 2016-10-30 NOTE — Patient Instructions (Addendum)
For your neuropathy/nerve type pain I will rx neurontin again. Official instruction will be 1 tab 3 times daily. But start with one tab for 4 days then advance to twice daily for 4 days. Then advance to 3 times daily. If not working or oversedation then could try other medication.  Part of fatigue and neuropathy may be related to low b12. Second im injection b12 today. b12 vitamin in one month.  Over next 2-3 weeks I want you to note if leg weakness or nerve pain associated with back pain. As might consider mri of lumber spine or ABI(vascular studies if symptoms persist).(but also might consider waiting and making sure b12 at good level before expanding work up)  Continue metformin for diabetes.  Placard form filled. Disabilty.(qualifyer neuropathy and leg weakness. Can walk 200 ft without resting per pt) In fact less distance.  Follow up December 21, 2016 or as needed(ask up front and explain I want 30 minute slot. Come early in am fasting. Want to check lipid panel on day repeat labs for diabetes

## 2016-11-06 ENCOUNTER — Ambulatory Visit: Payer: Medicare PPO

## 2016-11-30 ENCOUNTER — Ambulatory Visit (INDEPENDENT_AMBULATORY_CARE_PROVIDER_SITE_OTHER): Payer: Medicare PPO | Admitting: Behavioral Health

## 2016-11-30 ENCOUNTER — Telehealth: Payer: Self-pay | Admitting: Medical

## 2016-11-30 ENCOUNTER — Ambulatory Visit: Payer: Medicare PPO

## 2016-11-30 DIAGNOSIS — E538 Deficiency of other specified B group vitamins: Secondary | ICD-10-CM

## 2016-11-30 MED ORDER — CYANOCOBALAMIN 1000 MCG/ML IJ SOLN
1000.0000 ug | Freq: Once | INTRAMUSCULAR | Status: AC
  Administered 2016-11-30: 1000 ug via INTRAMUSCULAR

## 2016-11-30 NOTE — Telephone Encounter (Signed)
Pt is needing future orders for labs to check B-12 level for 2wk from today and also pt mentioned that needed lab orders for cholesterol. Please advise.

## 2016-11-30 NOTE — Progress Notes (Addendum)
Pre visit review using our clinic review tool, if applicable. No additional management support is needed unless otherwise documented below in the visit note.  Patient came in clinic for 3rd B12 injection. RN received verbal order from Whole FoodsEdward Saguier, VF CorporationPA-C. IM injection was given in the right deltoid. Patient tolerated injection well.  Agree with b-12 administration/ I ordered,  Saguier, Ramon DredgeEdward, PA-C

## 2016-11-30 NOTE — Telephone Encounter (Signed)
Future b12 order placed

## 2016-12-21 ENCOUNTER — Ambulatory Visit: Payer: Medicare PPO | Admitting: Medical

## 2016-12-28 ENCOUNTER — Encounter: Payer: Self-pay | Admitting: Medical

## 2016-12-28 ENCOUNTER — Ambulatory Visit (INDEPENDENT_AMBULATORY_CARE_PROVIDER_SITE_OTHER): Payer: Medicare PPO | Admitting: Medical

## 2016-12-28 VITALS — BP 147/70 | HR 56 | Temp 97.6°F | Resp 16 | Ht 74.0 in | Wt 172.8 lb

## 2016-12-28 DIAGNOSIS — E538 Deficiency of other specified B group vitamins: Secondary | ICD-10-CM

## 2016-12-28 DIAGNOSIS — E119 Type 2 diabetes mellitus without complications: Secondary | ICD-10-CM | POA: Diagnosis not present

## 2016-12-28 DIAGNOSIS — R29898 Other symptoms and signs involving the musculoskeletal system: Secondary | ICD-10-CM | POA: Diagnosis not present

## 2016-12-28 DIAGNOSIS — G629 Polyneuropathy, unspecified: Secondary | ICD-10-CM | POA: Diagnosis not present

## 2016-12-28 DIAGNOSIS — R030 Elevated blood-pressure reading, without diagnosis of hypertension: Secondary | ICD-10-CM | POA: Diagnosis not present

## 2016-12-28 LAB — COMPLETE METABOLIC PANEL WITH GFR
AG RATIO: 1.2 (calc) (ref 1.0–2.5)
ALBUMIN MSPROF: 3.9 g/dL (ref 3.6–5.1)
ALKALINE PHOSPHATASE (APISO): 94 U/L (ref 40–115)
ALT: 7 U/L — ABNORMAL LOW (ref 9–46)
AST: 12 U/L (ref 10–35)
BUN: 14 mg/dL (ref 7–25)
CO2: 29 mmol/L (ref 20–32)
CREATININE: 1.01 mg/dL (ref 0.70–1.25)
Calcium: 9.4 mg/dL (ref 8.6–10.3)
Chloride: 106 mmol/L (ref 98–110)
GFR, Est African American: 88 mL/min/{1.73_m2} (ref >=60)
GFR, Est Non African American: 76 mL/min/{1.73_m2} (ref >=60)
GLOBULIN: 3.2 g/dL (ref 1.9–3.7)
Glucose, Bld: 103 mg/dL — ABNORMAL HIGH (ref 65–99)
Potassium: 5.1 mmol/L (ref 3.5–5.3)
SODIUM: 140 mmol/L (ref 135–146)
Total Bilirubin: 0.5 mg/dL (ref 0.2–1.2)
Total Protein: 7.1 g/dL (ref 6.1–8.1)

## 2016-12-28 LAB — HEMOGLOBIN A1C: Hgb A1c MFr Bld: 6.3 % (ref 4.6–6.5)

## 2016-12-28 NOTE — Progress Notes (Signed)
Subjective:    Patient ID: Donald Frazier HeightJames A Harrower Sr., male    DOB: 04/20/1951, 66 y.o.   MRN: [id number]  HPI  Pt in for some leg weakness. Pt states after 10 minutes of walking  his legs will feel extremely weak(going on for at least 3 months or more). No leg cramps when walking. He states no longer has tingling sensation. He states occasionally rt hip has numb sensation But no direct hip pain. No urinary incontinence. No vision complaints or eye pain. No constant lower lumbar pain recently. But he does have a history of occasional chronic/intermittent back pain in the past.  No upper extremity weakness. No constant lower back pain. No radicular pain.  Lower leg extremity endurance is his main issue.  On work up of above symptoms neuropathy was considered when he had tingling. B-12 injections given 3 times over past. Tingling sensation now resolved.  Pt takes gabapentin only 1 tab at night.   Pt diabetic. a1-c was 6.9. Pt on metformin. No side effects.  Chronic low back pain for 10 years. Mild intermittent. Xray was normal. But no recent flare of back pain and no recent association of back pain with leg weakness. Had surgery in the past for his back but I don't see any recent MRI.     Review of Systems  Constitutional: Negative for chills, fatigue and fever.  HENT: Negative for congestion, ear discharge and ear pain.   Respiratory: Negative for cough, chest tightness, shortness of breath and wheezing.   Cardiovascular: Negative for chest pain and palpitations.  Gastrointestinal: Negative for abdominal distention, abdominal pain and blood in stool.  Musculoskeletal: Negative for arthralgias, back pain, joint swelling and neck stiffness.       Leg weakness. Bilaterally and noted when he walks.  Skin: Negative for rash.  Neurological: Negative for dizziness, tremors, seizures, speech difficulty, weakness, light-headedness and headaches.  Hematological: Negative for adenopathy. Does not  bruise/bleed easily.  Psychiatric/Behavioral: Negative for behavioral problems and confusion.    No past medical history on file.   Social History   Social History  . Marital status: Married    Spouse name: N/A  . Number of children: N/A  . Years of education: N/A   Occupational History  . Not on file.   Social History Main Topics  . Smoking status: Current Every Day Smoker    Packs/day: 1.00    Years: 60.00  . Smokeless tobacco: Never Used  . Alcohol use Yes     Comment: 1 can beer a night.(no heavy drinking recently)  . Drug use: No  . Sexual activity: No   Other Topics Concern  . Not on file   Social History Narrative  . No narrative on file    Past Surgical History:  Procedure Laterality Date  . BACK SURGERY    . HEMORRHOID SURGERY    . SHOULDER SURGERY Left     Family History  Problem Relation Age of Onset  . Hypertension Mother   . Diabetes Mother   . Hypertension Father   . Diabetes Father     No Known Allergies  Current Outpatient Prescriptions on File Prior to Visit  Medication Sig Dispense Refill  . gabapentin (NEURONTIN) 100 MG capsule Take 1 capsule (100 mg total) by mouth 3 (three) times daily. 90 capsule 0  . metFORMIN (GLUCOPHAGE-XR) 500 MG 24 hr tablet Take 1 tablet (500 mg total) by mouth daily with breakfast. 30 tablet 2   No current facility-administered  medications on file prior to visit.     BP (!) 147/70   Pulse (!) 56   Temp 97.6 F (36.4 C) (Oral)   Resp 16   Ht  (1.88 m)   Wt 172 lb 12.8 oz (78.4 kg)   SpO2 98%   BMI 22.19 kg/m       Objective:   Physical Exam  General Mental Status- Alert. General Appearance- Not in acute distress.   Skin General: Color- Normal Color. Moisture- Normal Moisture.  Neck Carotid Arteries- Normal color. Moisture- Normal Moisture. No carotid bruits. No JVD.  Chest and Lung Exam Auscultation: Breath Sounds:-Normal.  Cardiovascular Auscultation:Rythm- Regular. Murmurs &  Other Heart Sounds:Auscultation of the heart reveals- No Murmurs.  Abdomen Inspection:-Inspeection Normal. Palpation/Percussion:Note:No mass. Palpation and Percussion of the abdomen reveal- Non Tender, Non Distended + BS, no rebound or guarding.   Neurologic Cranial Nerve exam:- CN III-XII intact(No nystagmus), symmetric smile. Strength:- 5/5 equal and symmetric strength both upper and lower extremities. Note the patient's lower extremity strength on flexion and extension show no weakness at all.  Lower extremities-I feel like his pulses are intact. Dorsalis pedis and posterior tibial.      Assessment & Plan:  For low b12 will continue with monthly b12 injections. Recheck b12 level in 3 months.  Can continue gabapentin for neuropathy. Can increase to three times daily now that does not make you drowsy.  For diabetes get a1c and cmp today. Adjust diabetic meds accordingly.  For elevated bp use at home bp cuff. Check daily and notify me in 2 weeks on daily readings. If bp close to 140/90 will rx losartan to lower bp and protect kidneys.  For leg weakness you describe will refer you to neurologist for more in depth work up. By history and exam weakness does not appear vascular, from back and you are not improving with b12 vitamins. So I think neurologic evaluation would be beneficial.  Follow up date to be determined after lab review.  Courtenay Hirth, Ramon Dredge, PA-C

## 2016-12-28 NOTE — Patient Instructions (Addendum)
For low b12 will continue with monthly b12 injections. Recheck b12 level in 3 months.  Can continue gabapentin for neuropathy. Can increase to three times daily now that does not make you drowsy.  For diabetes get a1c and cmp today. Adjust diabetic meds accordingly.  For elevated bp use at home bp cuff. Check daily and notify me in 2 weeks on daily readings. If bp close to 140/90 will rx losartan to lower bp and protect kidneys.  For leg weakness you describe will refer you to neurologist for more in depth work up. By history and exam weakness does not appear vascular, from back and you are not improving with b12 vitamins. So I think neurologic evaluation would be beneficial.  Follow up date to be determined after lab review.

## 2016-12-29 ENCOUNTER — Telehealth: Payer: Self-pay | Admitting: Medical

## 2016-12-29 MED ORDER — METFORMIN HCL ER 500 MG PO TB24: 500.0000 mg | ORAL_TABLET | Freq: Every day | ORAL | 2 refills | Status: DC

## 2016-12-29 NOTE — Telephone Encounter (Signed)
Rx metformin sent to patient's pharmacy. 

## 2017-02-20 ENCOUNTER — Ambulatory Visit: Payer: Self-pay | Admitting: Neurology

## 2017-03-27 ENCOUNTER — Encounter: Payer: Self-pay | Admitting: Medical

## 2017-03-27 ENCOUNTER — Ambulatory Visit: Payer: Medicare PPO | Admitting: Medical

## 2017-03-27 VITALS — BP 140/80 | HR 69 | Temp 97.8°F | Resp 16 | Wt 173.2 lb

## 2017-03-27 DIAGNOSIS — M545 Low back pain, unspecified: Secondary | ICD-10-CM

## 2017-03-27 DIAGNOSIS — E538 Deficiency of other specified B group vitamins: Secondary | ICD-10-CM

## 2017-03-27 DIAGNOSIS — Z23 Encounter for immunization: Secondary | ICD-10-CM

## 2017-03-27 DIAGNOSIS — N529 Male erectile dysfunction, unspecified: Secondary | ICD-10-CM | POA: Diagnosis not present

## 2017-03-27 DIAGNOSIS — E119 Type 2 diabetes mellitus without complications: Secondary | ICD-10-CM | POA: Diagnosis not present

## 2017-03-27 DIAGNOSIS — I10 Essential (primary) hypertension: Secondary | ICD-10-CM

## 2017-03-27 LAB — LIPID PANEL
CHOL/HDL RATIO: 5
Cholesterol: 207 mg/dL — ABNORMAL HIGH (ref 0–200)
HDL: 42.9 mg/dL (ref >39.00)
LDL Cholesterol: 140 mg/dL — ABNORMAL HIGH (ref 0–99)
NonHDL: 164.1
Triglycerides: 119 mg/dL (ref 0.0–149.0)
VLDL: 23.8 mg/dL (ref 0.0–40.0)

## 2017-03-27 LAB — COMPREHENSIVE METABOLIC PANEL
ALT: 8 U/L (ref 0–53)
AST: 13 U/L (ref 0–37)
Albumin: 4.1 g/dL (ref 3.5–5.2)
Alkaline Phosphatase: 79 U/L (ref 39–117)
BUN: 11 mg/dL (ref 6–23)
CHLORIDE: 105 meq/L (ref 96–112)
CO2: 30 meq/L (ref 19–32)
Calcium: 9.2 mg/dL (ref 8.4–10.5)
Creatinine, Ser: 1.01 mg/dL (ref 0.40–1.50)
GFR: 94.24 mL/min (ref >60.00)
GLUCOSE: 92 mg/dL (ref 70–99)
POTASSIUM: 4.8 meq/L (ref 3.5–5.1)
Sodium: 140 mEq/L (ref 135–145)
Total Bilirubin: 0.6 mg/dL (ref 0.2–1.2)
Total Protein: 7.3 g/dL (ref 6.0–8.3)

## 2017-03-27 LAB — VITAMIN B12

## 2017-03-27 MED ORDER — LOSARTAN POTASSIUM 50 MG PO TABS: 50.0000 mg | ORAL_TABLET | Freq: Every day | ORAL | 3 refills | Status: DC

## 2017-03-27 MED ORDER — CYANOCOBALAMIN 1000 MCG/ML IJ SOLN
1000.0000 ug | Freq: Once | INTRAMUSCULAR | Status: AC
  Administered 2017-03-27: 1000 ug via INTRAMUSCULAR

## 2017-03-27 MED ORDER — SILDENAFIL CITRATE 50 MG PO TABS: ORAL_TABLET | ORAL | 0 refills | Status: DC

## 2017-03-27 NOTE — Progress Notes (Signed)
Subjective:    Patient ID: Donald Frazier HeightJames A Frances Sr., male    DOB: 11/16/1950, 66 y.o.   MRN: [id number]  HPI  Pt in for follow up.  He is still taking metformin. Pt is active at times. No hyperglycemic signs or symptoms.  Pt states his legs are feeling better. No longer weakness as before. Notices more when he uses blower and lifts things. But no radiating pain to his legs. Hx of djd on prior xray. No radicular pain to legs.  Pt has been on b12 injections and he got 3 months of injections.(he did have some tingling in legs but seemed to have dissapated as used b12 injections.  Pt declines flu vaccine. Pt  willing to get tdap. He declined pneumovaccine  Pt states will quite smoking near future but not decided to do so just yet.  BP is up today. Was last time as well. NO cardiac or neurologic signs or symptoms.  Review of Systems  Constitutional: Negative for chills, fatigue and fever.  HENT: Negative for congestion, drooling, ear pain and facial swelling.   Respiratory: Negative for cough, chest tightness, shortness of breath and wheezing.   Cardiovascular: Negative for chest pain and palpitations.  Gastrointestinal: Negative for abdominal distention, abdominal pain, blood in stool, diarrhea, nausea and vomiting.  Genitourinary: Negative for difficulty urinating, discharge, flank pain, penile pain, penile swelling, testicular pain and urgency.       ED.  Musculoskeletal: Positive for back pain.       Mild.  Skin: Negative for rash.  Neurological: Negative for dizziness, syncope, speech difficulty, weakness, light-headedness and headaches.  Hematological: Negative for adenopathy. Does not bruise/bleed easily.  Psychiatric/Behavioral: Negative for behavioral problems, confusion and dysphoric mood. The patient is not nervous/anxious.    No past medical history on file.   Social History   Socioeconomic History  . Marital status: Married    Spouse name: Not on file  . Number of  children: Not on file  . Years of education: Not on file  . Highest education level: Not on file  Social Needs  . Financial resource strain: Not on file  . Food insecurity - worry: Not on file  . Food insecurity - inability: Not on file  . Transportation needs - medical: Not on file  . Transportation needs - non-medical: Not on file  Occupational History  . Not on file  Tobacco Use  . Smoking status: Current Every Day Smoker    Packs/day: 1.00    Years: 60.00    Pack years: 60.00  . Smokeless tobacco: Never Used  Substance and Sexual Activity  . Alcohol use: Yes    Comment: 1 can beer a night.(no heavy drinking recently)  . Drug use: No  . Sexual activity: No  Other Topics Concern  . Not on file  Social History Narrative  . Not on file    Past Surgical History:  Procedure Laterality Date  . BACK SURGERY    . HEMORRHOID SURGERY    . SHOULDER SURGERY Left     Family History  Problem Relation Age of Onset  . Hypertension Mother   . Diabetes Mother   . Hypertension Father   . Diabetes Father     No Known Allergies  Current Outpatient Medications on File Prior to Visit  Medication Sig Dispense Refill  . gabapentin (NEURONTIN) 100 MG capsule Take 1 capsule (100 mg total) by mouth 3 (three) times daily. 90 capsule 0  . metFORMIN (GLUCOPHAGE-XR) 500  MG 24 hr tablet Take 1 tablet (500 mg total) by mouth daily with breakfast. 90 tablet 2   No current facility-administered medications on file prior to visit.     BP (!) 150/80   Pulse 69   Temp 97.8 F (36.6 C) (Oral)   Resp 16   Wt 173 lb 3.2 oz (78.6 kg)   SpO2 97%   BMI 22.24 kg/m       Objective:   Physical Exam   General Appearance- Not in acute distress.    Chest and Lung Exam Auscultation: Breath sounds:-Normal. Clear even and unlabored. Adventitious sounds:- No Adventitious sounds.  Cardiovascular Auscultation:Rythm - Regular, rate and rythm. Heart Sounds -Normal heart  sounds.  Abdomen Inspection:-Inspection Normal.  Palpation/Perucssion: Palpation and Percussion of the abdomen reveal- Non Tender, No Rebound tenderness, No rigidity(Guarding) and No Palpable abdominal masses.  Liver:-Normal.  Spleen:- Normal.   Back No Mid lumbar spine tenderness to palpation. Pain on straight leg lift. Pain on lateral movements and flexion/extension of the spine.  Lower ext neurologic  L5-S1 sensation intact bilaterally. Normal patellar reflexes bilaterally. No foot drop bilaterally.     Assessment & Plan:  For your history of diabetes, we will get A1c and urine microalbumin today.  After reviewing labs might increase metformin.  For history of low B12 we will give B12 injection today and get level.  Also Tdap given.  Your back pain is intermittent and mild.  You have degenerative changes of your spine.  Treatment does not appear necessary nor does further workup.  But if you have worse pain please let me know.  Your blood pressure is elevated today and was on the last visit as well.  We will give losartan blood pressure medication to keep blood pressure controlled and protect kidneys.  For rectal dysfunction Rx of Viagra given.(Rx advised and given)  Please get labs today.  Follow-up likely in 3 months or as needed.

## 2017-03-27 NOTE — Patient Instructions (Signed)
For your history of diabetes, we will get A1c and urine microalbumin today.  After reviewing labs might increase metformin.  For history of low B12 we will give B12 injection today and get level.  Also Tdap given.  Your back pain is intermittent and mild.  You have degenerative changes of your spine.  Treatment does not appear necessary nor does further workup.  But if you have worse pain please let me know.  Your blood pressure is elevated today and was on the last visit as well.  We will give losartan blood pressure medication to keep blood pressure controlled and protect kidneys.  For rectal dysfunction Rx of Viagra given.(Rx advised and given)  Please get labs today.  Follow-up likely in 3 months or as needed.

## 2017-03-28 LAB — MICROALBUMIN, URINE: MICROALB UR: 0.5 mg/dL

## 2017-04-27 ENCOUNTER — Emergency Department (HOSPITAL_BASED_OUTPATIENT_CLINIC_OR_DEPARTMENT_OTHER)
Admission: EM | Admit: 2017-04-27 | Discharge: 2017-04-27 | Disposition: A | Discharge status: Home / Self Care | Payer: Medicare PPO | Attending: Emergency Medicine | Admitting: Emergency Medicine

## 2017-04-27 ENCOUNTER — Emergency Department (HOSPITAL_BASED_OUTPATIENT_CLINIC_OR_DEPARTMENT_OTHER): Payer: Medicare PPO

## 2017-04-27 ENCOUNTER — Other Ambulatory Visit: Payer: Self-pay

## 2017-04-27 ENCOUNTER — Encounter (HOSPITAL_BASED_OUTPATIENT_CLINIC_OR_DEPARTMENT_OTHER): Payer: Self-pay | Admitting: Emergency Medicine

## 2017-04-27 DIAGNOSIS — F17210 Nicotine dependence, cigarettes, uncomplicated: Secondary | ICD-10-CM | POA: Diagnosis not present

## 2017-04-27 DIAGNOSIS — Z79899 Other long term (current) drug therapy: Secondary | ICD-10-CM | POA: Insufficient documentation

## 2017-04-27 DIAGNOSIS — N23 Unspecified renal colic: Secondary | ICD-10-CM | POA: Diagnosis not present

## 2017-04-27 DIAGNOSIS — I739 Peripheral vascular disease, unspecified: Secondary | ICD-10-CM | POA: Insufficient documentation

## 2017-04-27 DIAGNOSIS — R1011 Right upper quadrant pain: Secondary | ICD-10-CM | POA: Diagnosis present

## 2017-04-27 LAB — CBC WITH DIFFERENTIAL/PLATELET
Basophils Absolute: 0 10*3/uL (ref 0.0–0.1)
Basophils Relative: 0 %
EOS ABS: 0.1 10*3/uL (ref 0.0–0.7)
Eosinophils Relative: 1 %
HEMATOCRIT: 41.9 % (ref 39.0–52.0)
Hemoglobin: 13.7 g/dL (ref 13.0–17.0)
LYMPHS ABS: 0.8 10*3/uL (ref 0.7–4.0)
Lymphocytes Relative: 9 %
MCH: 28.6 pg (ref 26.0–34.0)
MCHC: 32.7 g/dL (ref 30.0–36.0)
MCV: 87.5 fL (ref 78.0–100.0)
MONOS PCT: 6 %
Monocytes Absolute: 0.6 10*3/uL (ref 0.1–1.0)
NEUTROS PCT: 84 %
Neutro Abs: 7.9 10*3/uL — ABNORMAL HIGH (ref 1.7–7.7)
Platelets: 376 10*3/uL (ref 150–400)
RBC: 4.79 MIL/uL (ref 4.22–5.81)
RDW: 14.9 % (ref 11.5–15.5)
WBC: 9.4 10*3/uL (ref 4.0–10.5)

## 2017-04-27 LAB — URINALYSIS, ROUTINE W REFLEX MICROSCOPIC
Bilirubin Urine: NEGATIVE
GLUCOSE, UA: NEGATIVE mg/dL
KETONES UR: NEGATIVE mg/dL
Leukocytes, UA: NEGATIVE
Nitrite: NEGATIVE
PROTEIN: NEGATIVE mg/dL
Specific Gravity, Urine: >1.03 — ABNORMAL HIGH (ref 1.005–1.030)
pH: 6 (ref 5.0–8.0)

## 2017-04-27 LAB — COMPREHENSIVE METABOLIC PANEL
ALT: 11 U/L — AB (ref 17–63)
AST: 22 U/L (ref 15–41)
Albumin: 3.8 g/dL (ref 3.5–5.0)
Alkaline Phosphatase: 85 U/L (ref 38–126)
Anion gap: 8 (ref 5–15)
BUN: 12 mg/dL (ref 6–20)
CHLORIDE: 102 mmol/L (ref 101–111)
CO2: 25 mmol/L (ref 22–32)
Calcium: 8.9 mg/dL (ref 8.9–10.3)
Creatinine, Ser: 1.55 mg/dL — ABNORMAL HIGH (ref 0.61–1.24)
GFR, EST AFRICAN AMERICAN: 51 mL/min — AB (ref >60)
GFR, EST NON AFRICAN AMERICAN: 44 mL/min — AB (ref >60)
Glucose, Bld: 116 mg/dL — ABNORMAL HIGH (ref 65–99)
POTASSIUM: 4.4 mmol/L (ref 3.5–5.1)
SODIUM: 135 mmol/L (ref 135–145)
Total Bilirubin: 0.8 mg/dL (ref 0.3–1.2)
Total Protein: 7.4 g/dL (ref 6.5–8.1)

## 2017-04-27 LAB — URINALYSIS, MICROSCOPIC (REFLEX): WBC, UA: NONE SEEN WBC/hpf (ref 0–5)

## 2017-04-27 MED ORDER — SODIUM CHLORIDE 0.9 % IV BOLUS (SEPSIS)
500.0000 mL | Freq: Once | INTRAVENOUS | Status: AC
  Administered 2017-04-27: 500 mL via INTRAVENOUS

## 2017-04-27 MED ORDER — ONDANSETRON HCL 4 MG/2ML IJ SOLN: 4.0000 mg | Freq: Once | INTRAMUSCULAR | Status: DC

## 2017-04-27 MED ORDER — MORPHINE SULFATE (PF) 4 MG/ML IV SOLN: 4.0000 mg | Freq: Once | INTRAVENOUS | Status: DC

## 2017-04-27 MED ORDER — HYDROCODONE-ACETAMINOPHEN 5-325 MG PO TABS: 1.0000 | ORAL_TABLET | Freq: Four times a day (QID) | ORAL | 0 refills | Status: DC | PRN

## 2017-04-27 NOTE — ED Notes (Signed)
Pt states pain is improved at this time. Will notify RN if pain increases again

## 2017-04-27 NOTE — ED Provider Notes (Signed)
[facility] EMERGENCY DEPARTMENT Provider Note   CSN: [id number] Arrival date & time: 04/27/17  1651     History   Chief Complaint Chief Complaint  Patient presents with  . Flank Pain    HPI Donald GALENTINE Sr. is a 67 y.o. male.  The history is provided by the patient and the spouse. No language interpreter was used.  Flank Pain     Donald SHIPPER Sr. is a 67 y.o. male who presents to the Emergency Department complaining of flank pain.  He reports severe right-sided flank pain that began around 10 this morning.  It is located in the right flank and radiates to the right lower quadrant.  He has associated nausea with vomiting x2.  No fevers, diarrhea, dysuria, hematuria.  He has a history of hypertension and diabetes.  No prior similar symptoms.  Overall symptoms are improving.  History reviewed. No pertinent past medical history.  Patient Active Problem List   Diagnosis Date Noted  . Pain in joint, shoulder region 08/10/2014  . Pain in the abdomen 07/08/2014    Past Surgical History:  Procedure Laterality Date  . BACK SURGERY    . HEMORRHOID SURGERY    . SHOULDER SURGERY Left        Home Medications    Prior to Admission medications   Medication Sig Start Date End Date Taking? Authorizing Provider  gabapentin (NEURONTIN) 100 MG capsule Take 1 capsule (100 mg total) by mouth 3 (three) times daily. 10/30/16   Saguier, Ramon Dredge, PA-C  HYDROcodone-acetaminophen (NORCO/VICODIN) 5-325 MG tablet Take 1 tablet by mouth every 6 (six) hours as needed. 04/27/17   Tilden Fossa, MD  losartan (COZAAR) 50 MG tablet Take 1 tablet (50 mg total) by mouth daily. 03/27/17   Saguier, Ramon Dredge, PA-C  metFORMIN (GLUCOPHAGE-XR) 500 MG 24 hr tablet Take 1 tablet (500 mg total) by mouth daily with breakfast. 12/29/16   Saguier, Ramon Dredge, PA-C  sildenafil (VIAGRA) 50 MG tablet 1 tab po 1 hour prior to sex 03/27/17   Esperanza Richters, PA-C    Family History Family History  Problem  Relation Age of Onset  . Hypertension Mother   . Diabetes Mother   . Hypertension Father   . Diabetes Father     Social History Social History   Tobacco Use  . Smoking status: Current Every Day Smoker    Packs/day: 1.00    Years: 60.00    Pack years: 60.00  . Smokeless tobacco: Never Used  Substance Use Topics  . Alcohol use: Yes    Comment: 1 can beer a night.(no heavy drinking recently)  . Drug use: No     Allergies   Patient has no known allergies.   Review of Systems Review of Systems  Genitourinary: Positive for flank pain.  All other systems reviewed and are negative.    Physical Exam Updated Vital Signs BP (!) 142/64 (BP Location: Right Arm)   Pulse 66   Temp 97.7 F (36.5 C) (Oral)   Resp 20   Ht 6\' 2"  (1.88 m)   Wt 80.3 kg (177 lb)   SpO2 98%   BMI 22.73 kg/m   Physical Exam  Constitutional: He is oriented to person, place, and time. He appears well-developed and well-nourished.  HENT:  Head: Normocephalic and atraumatic.  Cardiovascular: Normal rate and regular rhythm.  No murmur heard. Pulmonary/Chest: Effort normal and breath sounds normal. No respiratory distress.  Abdominal: Soft. There is no tenderness. There is no rebound  and no guarding.  Musculoskeletal: He exhibits no edema or tenderness.  2+ DP pulses bilaterally  Neurological: He is alert and oriented to person, place, and time.  Skin: Skin is warm and dry.  Psychiatric: He has a normal mood and affect. His behavior is normal.  Nursing note and vitals reviewed.    ED Treatments / Results  Labs (all labs ordered are listed, but only abnormal results are displayed) Labs Reviewed  URINALYSIS, ROUTINE W REFLEX MICROSCOPIC - Abnormal; Notable for the following components:      Result Value   Color, Urine BROWN (*)    APPearance CLOUDY (*)    Specific Gravity, Urine >1.030 (*)    Hgb urine dipstick LARGE (*)    All other components within normal limits  COMPREHENSIVE METABOLIC  PANEL - Abnormal; Notable for the following components:   Glucose, Bld 116 (*)    Creatinine, Ser 1.55 (*)    ALT 11 (*)    GFR calc non Af Amer 44 (*)    GFR calc Af Amer 51 (*)    All other components within normal limits  CBC WITH DIFFERENTIAL/PLATELET - Abnormal; Notable for the following components:   Neutro Abs 7.9 (*)    All other components within normal limits  URINALYSIS, MICROSCOPIC (REFLEX) - Abnormal; Notable for the following components:   Bacteria, UA RARE (*)    Squamous Epithelial / LPF 0-5 (*)    All other components within normal limits    EKG  EKG Interpretation None       Radiology Ct Renal Stone Study  Result Date: 04/27/2017 CLINICAL DATA:  Acute onset right flank pain today. Blood on urinalysis. EXAM: CT ABDOMEN AND PELVIS WITHOUT CONTRAST TECHNIQUE: Multidetector CT imaging of the abdomen and pelvis was performed following the standard protocol without IV contrast. COMPARISON:  Previous comparison study from 06/15/2015 is not available within the system. FINDINGS: Lower chest: Mild dependent atelectasis in the lung bases. Hepatobiliary: No focal liver abnormality is seen. No gallstones, gallbladder wall thickening, or biliary dilatation. Pancreas: Unremarkable. No pancreatic ductal dilatation or surrounding inflammatory changes. Spleen: Normal in size without focal abnormality. Adrenals/Urinary Tract: No adrenal gland nodules. 3 mm stone in the posterior bladder base to the right. This could be in the right ureterovesical junction or recently passed into the bladder. There is right hydronephrosis and hydroureter with stranding around the right kidney. No additional stones are demonstrated. Left kidney and ureter are unremarkable. Bladder wall is diffusely thickened which could be due to under distention but may indicate cystitis. Stomach/Bowel: Stomach is within normal limits. Appendix appears normal. No evidence of bowel wall thickening, distention, or inflammatory  changes. Diverticulosis of the colon without evidence of diverticulitis. Vascular/Lymphatic: Extensive vascular calcification throughout the abdominal aorta. Prominent calcifications of the infrarenal aorta likely result in significant calcific stenosis. Calcification in the iliac arteries also may indicate stenosis. No significant lymphadenopathy. Reproductive: Prostate gland is enlarged, measuring 4.5 cm diameter. Prostate calcifications. Other: No free air or free fluid in the abdomen. Abdominal wall musculature appears intact. Musculoskeletal: Degenerative changes in the spine. No acute displaced fractures identified. IMPRESSION: 1. Moderate obstructive changes demonstrated in the right renal collecting system and right ureter. 3 mm stone in the posterior bladder base to the right could be in the right ureterovesical junction or may have been recently passed into the bladder. 2. Extensive vascular calcifications likely representing calcific stenosis of the aorta and iliac artery bilaterally. 3. Enlarged prostate gland. Electronically Signed  By: Burman NievesWilliam  Stevens M.D.   On: 04/27/2017 18:41    Procedures Procedures (including critical care time)  Medications Ordered in ED Medications  sodium chloride 0.9 % bolus 500 mL (0 mLs Intravenous Stopped 04/27/17 1956)     Initial Impression / Assessment and Plan / ED Course  I have reviewed the triage vital signs and the nursing notes.  Pertinent labs & imaging results that were available during my care of the patient were reviewed by me and considered in my medical decision making (see chart for details).     In here for evaluation of right flank pain.  His pain is improved and resolved on repeat evaluation in the emergency department.  Imaging demonstrates a stone that passed into the bladder.  UA is not consistent with urinary tract infection.  Counseled patient on home care for renal colic with urology follow-up.  Patient was noted to have  significant atherosclerosis on his imaging.  He does have good perfusion and strong pulses on examination.  Discussed with patient his finding and on further review of systems he does endorse that he has lower extremity fatigue and weakness with exertion, question some degree of claudication.  Discussed with patient initiating an aspirin daily with close vascular surgery follow-up for further evaluation.  Presentation is not consistent with aortic dissection.  Discussed close return precautions for both renal colic as well as PVD.  Final Clinical Impressions(s) / ED Diagnoses   Final diagnoses:  Renal colic on right side  Peripheral vascular disease [facility](HCC)    ED Discharge Orders        Ordered    HYDROcodone-acetaminophen (NORCO/VICODIN) 5-325 MG tablet  Every 6 hours PRN     04/27/17 1941       Tilden Fossaees, Darcell Sabino, MD 04/28/17 [phone]

## 2017-04-27 NOTE — ED Notes (Signed)
Patient transported to CT 

## 2017-04-27 NOTE — ED Triage Notes (Addendum)
Patient states that he has had right side flank pain that radiates around to his right upper quad region  - patient currently throwing up in triage.

## 2017-04-27 NOTE — ED Notes (Signed)
Pt given d/c instructions as per chart. Rx x 1 with precautions. Verbalizes understanding. No questions. 

## 2017-04-28 ENCOUNTER — Telehealth: Payer: Self-pay | Admitting: Medical

## 2017-04-28 NOTE — Telephone Encounter (Signed)
I did review patient's emergency department visit and saw that he had a lot of atherosclerosis on his CT scan.  This was incidental finding for workup of kidney stone.  He does have a history of smoking and some moderate high cholesterol.  In the past I had wanted him to start low-dose statin but did not get word back from him whether or not he was willing to start the medication.  Would you please remind him again that I do want to write the prescription.  Also in light of the CT findings I want to refer him to vascular surgeon.  Get their opinion if his former leg weakness is from calcifications/atherosclerosis in the lower extremities.  Please have him schedule appointment with me sometime early morning or early afternoon.  Explained  8-9 am appointment would be convenient or 1-2 pm.

## 2017-04-29 ENCOUNTER — Telehealth: Payer: Self-pay | Admitting: Medical

## 2017-04-29 DIAGNOSIS — I70219 Atherosclerosis of native arteries of extremities with intermittent claudication, unspecified extremity: Secondary | ICD-10-CM

## 2017-04-29 DIAGNOSIS — N200 Calculus of kidney: Secondary | ICD-10-CM

## 2017-04-29 DIAGNOSIS — I739 Peripheral vascular disease, unspecified: Secondary | ICD-10-CM

## 2017-04-29 MED ORDER — ATORVASTATIN CALCIUM 10 MG PO TABS: 10.0000 mg | ORAL_TABLET | Freq: Every day | ORAL | 0 refills | Status: DC

## 2017-04-29 NOTE — Telephone Encounter (Signed)
Talked to Mrs. Amie CritchleyBlackwell (pt's wife), she is aware of the artherosclerosis, she was given the name of [facility] the hospital his name is [facility] and she will like for you to initiate this referral as well as urology referral to Dr. [name] Acheheodore Manny at [facility]. She Prefers to go ahead and see this Doctors before following up with us. They are ok with starting low dose statin for the cholesterol. Per wife [facility]

## 2017-04-29 NOTE — Telephone Encounter (Signed)
rx lipitor. Referral to vascular surgeon and urologist as well.

## 2017-05-06 ENCOUNTER — Telehealth: Payer: Self-pay

## 2017-05-06 NOTE — Telephone Encounter (Signed)
Copied from CRM [id number]. Topic: Referral - Request >> Apr 29, 2017  3:55 PM Jolayne Hainesaylor, Brittany L wrote: Needs a referral to a heart doctor. Was seen in the ER over the weekend. He was diagnosed with hardened arteries. She needs it to be [employer] network Please call patients wife @ [phone]  >> Apr 29, 2017  4:07 PM Terisa Starraylor, Brittany L wrote: Pt's wife called back and said disregard the referral, he is going to see the vascular dr. She said he needs a referral for [facility] [address] [place] [facility] PHONE IS [phone] >> Apr 29, 2017  4:09 PM Terisa Starraylor, Brittany L wrote: Pt's wife also said that he needs a referral to a urologist in the [employer] insurance network. They recommended from the ER Dr [name] AcheManny Theodore @ [address] [place] number is [phone]

## 2017-05-07 NOTE — Telephone Encounter (Signed)
I have already made both referrals.  Vascular surgeon Dr. Edilia Boickson and urologist of her choice Dr. [name] AcheManny Theodore.   Please see the CRM address information and phone numbers.  [facility] can be done.

## 2017-05-08 NOTE — Telephone Encounter (Signed)
Referrals have been placed.  

## 2017-05-13 ENCOUNTER — Other Ambulatory Visit: Payer: Self-pay

## 2017-05-13 DIAGNOSIS — I739 Peripheral vascular disease, unspecified: Secondary | ICD-10-CM

## 2017-05-14 ENCOUNTER — Encounter: Payer: Self-pay | Admitting: Vascular Surgery

## 2017-05-14 ENCOUNTER — Ambulatory Visit (INDEPENDENT_AMBULATORY_CARE_PROVIDER_SITE_OTHER): Payer: Medicare PPO | Admitting: Vascular Surgery

## 2017-05-14 ENCOUNTER — Ambulatory Visit (HOSPITAL_COMMUNITY)
Admission: RE | Admit: 2017-05-14 | Discharge: 2017-05-14 | Disposition: A | Discharge status: Home / Self Care | Payer: Medicare PPO | Source: Ambulatory Visit | Attending: Vascular Surgery | Admitting: Vascular Surgery

## 2017-05-14 VITALS — BP 134/70 | HR 79 | Resp 20 | Ht 74.0 in | Wt 173.0 lb

## 2017-05-14 DIAGNOSIS — I739 Peripheral vascular disease, unspecified: Secondary | ICD-10-CM | POA: Diagnosis not present

## 2017-05-14 NOTE — Progress Notes (Signed)
Patient name: Donald RUTLEDGE Sr. MRN: [id number] DOB: 1950/11/25 Sex: male   REASON FOR CONSULT:    Peripheral vascular disease.  The consult is requested byEdward Saguier, PA  HPI:   Donald NUON Sr. is a pleasant 67 y.o. male, who is referred for evaluation of claudication.  He noted the gradual onset of pain in his hips, thighs, and calves which is brought on by ambulation and relieved with rest.  His symptoms are equal on both sides.  He can walk about 30 feet before experiencing symptoms.  He was recently started on medication for his diabetes and high blood pressure and states that his symptoms have improved somewhat.  He denies any history of rest pain or nonhealing ulcers.  He does not have any significant back pain.  His risk factors for peripheral vascular disease include diabetes, hypertension, hypercholesterolemia, and tobacco use.  I reviewed the record from the referring office.  The patient was seen on 03/27/2017 in follow-up.  He does have a history of diabetes and is on metformin.  He also has a history of B12 deficiency.  He has been having some intermittent back pain.  His blood pressure was elevated at the last visit.   History reviewed. No pertinent past medical history.  Diabetes, hypertension, hypercholesterolemia   Family History  Problem Relation Age of Onset  . Hypertension Mother   . Diabetes Mother   . Hypertension Father   . Diabetes Father    No family history of premature cardiovascular disease  SOCIAL HISTORY: He smoked 1 pack/day for 60 years but quit on 04/23/2017 Social History   Socioeconomic History  . Marital status: Married    Spouse name: Not on file  . Number of children: Not on file  . Years of education: Not on file  . Highest education level: Not on file  Social Needs  . Financial resource strain: Not on file  . Food insecurity - worry: Not on file  . Food insecurity - inability: Not on file  . Transportation needs -  medical: Not on file  . Transportation needs - non-medical: Not on file  Occupational History  . Not on file  Tobacco Use  . Smoking status: Former Smoker    Packs/day: 1.00    Years: 60.00    Pack years: 60.00    Types: Cigarettes    Last attempt to quit: 04/23/2017    Years since quitting: 0.0  . Smokeless tobacco: Never Used  Substance and Sexual Activity  . Alcohol use: Yes    Comment: 1 can beer a night.(no heavy drinking recently)  . Drug use: No  . Sexual activity: No  Other Topics Concern  . Not on file  Social History Narrative  . Not on file    No Known Allergies  Current Outpatient Medications  Medication Sig Dispense Refill  . atorvastatin (LIPITOR) 10 MG tablet Take 1 tablet (10 mg total) by mouth daily. 90 tablet 0  . gabapentin (NEURONTIN) 100 MG capsule Take 1 capsule (100 mg total) by mouth 3 (three) times daily. 90 capsule 0  . HYDROcodone-acetaminophen (NORCO/VICODIN) 5-325 MG tablet Take 1 tablet by mouth every 6 (six) hours as needed. 10 tablet 0  . losartan (COZAAR) 50 MG tablet Take 1 tablet (50 mg total) by mouth daily. 30 tablet 3  . metFORMIN (GLUCOPHAGE-XR) 500 MG 24 hr tablet Take 1 tablet (500 mg total) by mouth daily with breakfast. 90 tablet 2  . sildenafil (VIAGRA)  50 MG tablet 1 tab po 1 hour prior to sex 10 tablet 0   No current facility-administered medications for this visit.     REVIEW OF SYSTEMS:  [X]  denotes positive finding, [ ]  denotes negative finding Cardiac  Comments:  Chest pain or chest pressure:    Shortness of breath upon exertion:    Short of breath when lying flat:    Irregular heart rhythm:        Vascular    Pain in calf, thigh, or hip brought on by ambulation: X   Pain in feet at night that wakes you up from your sleep:     Blood clot in your veins:    Leg swelling:         Pulmonary    Oxygen at home:    Productive cough:     Wheezing:         Neurologic    Sudden weakness in arms or legs:     Sudden  numbness in arms or legs:  X  he does occasionally get paresthesias in his legs after ambulating.  Sudden onset of difficulty speaking or slurred speech:    Temporary loss of vision in one eye:     Problems with dizziness:         Gastrointestinal    Blood in stool:     Vomited blood:         Genitourinary    Burning when urinating:     Blood in urine:        Psychiatric    Major depression:         Hematologic    Bleeding problems:    Problems with blood clotting too easily:        Skin    Rashes or ulcers:        Constitutional    Fever or chills:     PHYSICAL EXAM:   There were no vitals filed for this visit.  GENERAL: The patient is a well-nourished male, in no acute distress. The vital signs are documented above. CARDIAC: There is a regular rate and rhythm.  VASCULAR: I do not detect carotid bruits. He has palpable radial pulses bilaterally. I cannot palpate femoral, popliteal, or pedal pulses. He has no significant lower extremity swelling. PULMONARY: There is good air exchange bilaterally without wheezing or rales. ABDOMEN: Soft and non-tender with normal pitched bowel sounds.  I do not palpate an abdominal aortic aneurysm. MUSCULOSKELETAL: There are no major deformities or cyanosis. NEUROLOGIC: No focal weakness or paresthesias are detected. SKIN: There are no ulcers or rashes noted. PSYCHIATRIC: The patient has a normal affect.  DATA:    LOWER EXTREMITY ARTERIAL DOPPLER STUDY: I did independently interpreted his lower extremity arterial Doppler study today.  On the right side he has monophasic Doppler signals in the dorsalis pedis and posterior tibial positions.  ABI on the right is 45%.  On the left side he has a monophasic dorsalis pedis and posterior tibial signal.  ABI on the left is 41%.  MEDICAL ISSUES:   PERIPHERAL VASCULAR DISEASE: Based on his exam he certainly has evidence of aortoiliac occlusive disease.  I cannot palpate femoral pulses.  Given  that his ABIs are less than 50% I suspect that he also has infrainguinal arterial occlusive disease.  We have discussed the importance of a structured walking program.  I have encouraged him to stay off of the cigarettes.  We have also discussed the importance of nutrition and trying  to eat as many vegetables as possible.  I did think he might be a good candidate for the vascular rehab program but this is not quite up and running.  Perhaps by the time I see him next he will be a candidate for the cardiovascular wellness center and can get on an supervised structured walking program.    I have also discussed the option of proceeding with a CT angiogram to see if he has any disease amenable to angioplasty however given that his symptoms are improving I would favor conservative treatment for now.   I have ordered follow-up ABIs in 6 months and I will see him back at that time.  He knows to call sooner if he has problems.    [facility] Samanta [facility] [place] [phone]

## 2017-05-27 NOTE — Addendum Note (Signed)
Addended by: Burton ApleyPETTY, Makyiah Lie A on: 05/27/2017 03:51 PM   Modules accepted: Orders

## 2017-05-29 ENCOUNTER — Telehealth: Payer: Self-pay

## 2017-05-29 DIAGNOSIS — Z1211 Encounter for screening for malignant neoplasm of colon: Secondary | ICD-10-CM

## 2017-05-29 NOTE — Telephone Encounter (Signed)
Copied from CRM [phone]#[id number]. Topic: Referral - Request >> May 29, 2017 12:04 PM Jolayne Hainesaylor, Brittany L wrote: Pt is requesting to have a colonoscopy either in [place] or . Call back is [phone]

## 2017-05-29 NOTE — Telephone Encounter (Signed)
I placed referral to the [facility].  I looked in our records and I do not see that he is ever had colonoscopy.  If he has had one in the past we need to get those results as the specialist will want to review those before doing second colonoscopy.

## 2017-05-30 ENCOUNTER — Encounter: Payer: Self-pay | Admitting: Internal Medicine

## 2017-05-31 ENCOUNTER — Telehealth: Payer: Self-pay

## 2017-05-31 NOTE — Telephone Encounter (Signed)
Copied from CRM [id number]. Topic: General - Other >> May 31, 2017 10:32 AM Gerrianne ScalePayne, Angela L wrote: Reason for CRM: patient wife Donald Frazier states that someone had called her about husbands referral

## 2017-05-31 NOTE — Telephone Encounter (Signed)
Pt's wife states pt has had a colonoscopy she will look for the paper work and bring it in.

## 2017-06-05 ENCOUNTER — Encounter: Payer: Medicare PPO | Admitting: Vascular Surgery

## 2017-06-05 ENCOUNTER — Encounter (HOSPITAL_COMMUNITY): Payer: Medicare PPO

## 2017-06-11 ENCOUNTER — Telehealth: Payer: Self-pay | Admitting: *Deleted

## 2017-06-11 NOTE — Telephone Encounter (Signed)
Received note back from request for medical records from [facility] that they have No Records for this patient; forwarded to provider/SLS 02/19

## 2017-06-26 ENCOUNTER — Telehealth: Payer: Self-pay | Admitting: Medical

## 2017-06-26 NOTE — Telephone Encounter (Signed)
Copied from CRM [phone]. Topic: Quick Communication - Rx Refill/Question >> Jun 26, 2017  5:24 PM Landry MellowFoltz, Melissa J wrote: Medication:losartan - pharmacist told pt that he needs new rx called in, pharmacy can not get losartan in , pt needs substitute    Has the patient contacted their pharmacy? Yes.     (Agent: If no, request that the patient contact the pharmacy for the refill.)   Preferred Pharmacy (with phone number or street name): [facility] [address]  Pt has 2 pill left     Agent: Please be advised that RX refills may take up to 3 business days. We ask that you follow-up with your pharmacy.

## 2017-06-27 NOTE — Telephone Encounter (Signed)
Patient's spouse stated there is a recall on this medication, and they would like another high blood pressure prescription.  The patient will take his last one tomorrow.  [facility] [address] [place]

## 2017-06-28 ENCOUNTER — Telehealth: Payer: Self-pay | Admitting: Medical

## 2017-06-28 MED ORDER — LOSARTAN POTASSIUM 25 MG PO TABS: ORAL_TABLET | ORAL | 3 refills | Status: DC

## 2017-06-28 MED ORDER — LOSARTAN POTASSIUM 50 MG PO TABS: 50.0000 mg | ORAL_TABLET | Freq: Every day | ORAL | 1 refills | Status: DC

## 2017-06-28 NOTE — Telephone Encounter (Signed)
Did you see the note I try to send you about the med center is losartan 50 mg not being on the recall list.  So I sent patient prescription downstairs rather than changing to a new blood pressure medication and having to worry about dose equivalency and his blood pressure getting out of control.

## 2017-06-28 NOTE — Telephone Encounter (Signed)
Pt want to have medication for losartan 25 [facility] [address] - they are close to that location

## 2017-06-28 NOTE — Telephone Encounter (Signed)
Patient spouse is calling back and states the pharmacy does not have 50mg . They have the 25mg . Patient would like to know if a prescription can be sent over and take 2 25mg . Please contact patient.  CB# [phone]

## 2017-06-28 NOTE — Telephone Encounter (Signed)
[facility]. [facility] not on recall list. [facility] and wife [facility] only [facility] tab. [facility] Sig was 2 tab a day. [facility] those 25 mg tabs not on recall list.  Call [facility] [address] and see if they can fill. If not then advise/remind them [facility]

## 2017-06-28 NOTE — Telephone Encounter (Signed)
Pt's spouse state they do not us [facility] they [facility] [address]. Pharmacy states they do not have rx and do not know when they will have it. Pt's wife would like medication to be changed.

## 2017-06-28 NOTE — Telephone Encounter (Signed)
Let patient know that the manufacturer of losartan 50 mg tat that the med center has on hand is not on the recall list.  Apparently [facility] manufacturer is.  So I can send prescription downstairs of the same medication. This is what I would recommend.  If I change his medication his blood pressure might get out of control so I would go with the same medications/dosage but from different manufacturer.

## 2017-06-29 NOTE — Telephone Encounter (Signed)
[facility]

## 2017-07-02 NOTE — Telephone Encounter (Signed)
See additional phone note drom 06/28/17.

## 2017-07-02 NOTE — Telephone Encounter (Signed)
Spoke with pharmacy and confirmed that pt picked up Losartan Rx on 06/29/17 for 25mg , 2 tablets daily.

## 2017-08-02 ENCOUNTER — Other Ambulatory Visit: Payer: Self-pay | Admitting: Medical

## 2017-08-02 NOTE — Telephone Encounter (Signed)
Copied from CRM [id number]. Topic: Quick Communication - Rx Refill/Question >> Aug 02, 2017  4:17 PM Rudi CocoLathan, Miyuki Rzasa M, NT wrote: Medication:atorvastatin (LIPITOR) 10 MG tablet [[id number]][[id number]]  Has the patient contacted their pharmacy? yes (Agent: If no, request that the patient contact the pharmacy for the refill.) Preferred Pharmacy (with phone number or street name): [facility] [address] - [place], [place] - [address] [place] [address] Phone: [phone] Fax: [phone]   Agent: Please be advised that RX refills may take up to 3 business days. We ask that you follow-up with your pharmacy.

## 2017-08-05 ENCOUNTER — Other Ambulatory Visit: Payer: Self-pay

## 2017-08-05 MED ORDER — ATORVASTATIN CALCIUM 10 MG PO TABS: 10.0000 mg | ORAL_TABLET | Freq: Every day | ORAL | 0 refills | Status: DC

## 2017-08-05 NOTE — Telephone Encounter (Signed)
Copied from CRM [id number]. Topic: General - Other >> Aug 05, 2017  7:36 AM Oneal GroutSebastian, [name] wrote: Reason for CRM: Patient wife is requesting to speak with nurse, would not state why, declined to elaborate.       Spoke with patients spouse she states she had been calling for the past 2 weeks bc her husband was about to run out of medication.    I have sent in medication and scheduled an appointment.

## 2017-08-05 NOTE — Telephone Encounter (Signed)
Request for refill of Lipitor- ? Follow up due- no appointment  LOV: 03/27/17  PCP: Saguier  Pharmacy: verified

## 2017-08-22 ENCOUNTER — Encounter: Payer: Medicare PPO | Admitting: Internal Medicine

## 2017-09-05 ENCOUNTER — Telehealth: Payer: Self-pay | Admitting: Medical

## 2017-09-05 ENCOUNTER — Encounter: Payer: Self-pay | Admitting: Medical

## 2017-09-05 ENCOUNTER — Ambulatory Visit: Payer: Medicare PPO | Admitting: Medical

## 2017-09-05 VITALS — BP 140/80 | HR 64 | Temp 97.5°F | Resp 16 | Ht 74.0 in | Wt 182.4 lb

## 2017-09-05 DIAGNOSIS — I10 Essential (primary) hypertension: Secondary | ICD-10-CM

## 2017-09-05 DIAGNOSIS — Z87891 Personal history of nicotine dependence: Secondary | ICD-10-CM | POA: Diagnosis not present

## 2017-09-05 DIAGNOSIS — R918 Other nonspecific abnormal finding of lung field: Secondary | ICD-10-CM | POA: Diagnosis not present

## 2017-09-05 DIAGNOSIS — Z111 Encounter for screening for respiratory tuberculosis: Secondary | ICD-10-CM | POA: Diagnosis not present

## 2017-09-05 DIAGNOSIS — E785 Hyperlipidemia, unspecified: Secondary | ICD-10-CM | POA: Diagnosis not present

## 2017-09-05 DIAGNOSIS — E118 Type 2 diabetes mellitus with unspecified complications: Secondary | ICD-10-CM

## 2017-09-05 DIAGNOSIS — R911 Solitary pulmonary nodule: Secondary | ICD-10-CM | POA: Diagnosis not present

## 2017-09-05 LAB — LIPID PANEL
CHOLESTEROL: 160 mg/dL (ref 0–200)
HDL: 43.9 mg/dL (ref >39.00)
LDL Cholesterol: 88 mg/dL (ref 0–99)
NONHDL: 116.37
Total CHOL/HDL Ratio: 4
Triglycerides: 141 mg/dL (ref 0.0–149.0)
VLDL: 28.2 mg/dL (ref 0.0–40.0)

## 2017-09-05 LAB — COMPREHENSIVE METABOLIC PANEL
ALBUMIN: 4 g/dL (ref 3.5–5.2)
ALK PHOS: 80 U/L (ref 39–117)
ALT: 9 U/L (ref 0–53)
AST: 13 U/L (ref 0–37)
BILIRUBIN TOTAL: 0.9 mg/dL (ref 0.2–1.2)
BUN: 19 mg/dL (ref 6–23)
CO2: 29 mEq/L (ref 19–32)
Calcium: 9.5 mg/dL (ref 8.4–10.5)
Chloride: 103 mEq/L (ref 96–112)
Creatinine, Ser: 1.15 mg/dL (ref 0.40–1.50)
GFR: 81.02 mL/min (ref >60.00)
Glucose, Bld: 98 mg/dL (ref 70–99)
POTASSIUM: 4.8 meq/L (ref 3.5–5.1)
Sodium: 138 mEq/L (ref 135–145)
TOTAL PROTEIN: 7 g/dL (ref 6.0–8.3)

## 2017-09-05 LAB — HEMOGLOBIN A1C: HEMOGLOBIN A1C: 6.5 % (ref 4.6–6.5)

## 2017-09-05 MED ORDER — LOSARTAN POTASSIUM 50 MG PO TABS
50.0000 mg | ORAL_TABLET | Freq: Every day | ORAL | 1 refills | Status: AC
Start: 2017-09-05 — End: ?

## 2017-09-05 MED ORDER — ATORVASTATIN CALCIUM 10 MG PO TABS: 10.0000 mg | ORAL_TABLET | Freq: Every day | ORAL | 0 refills | Status: DC

## 2017-09-05 MED ORDER — METFORMIN HCL ER 500 MG PO TB24: 500.0000 mg | ORAL_TABLET | Freq: Every day | ORAL | 2 refills | Status: AC

## 2017-09-05 MED ORDER — GABAPENTIN 100 MG PO CAPS: 100.0000 mg | ORAL_CAPSULE | Freq: Three times a day (TID) | ORAL | 1 refills | Status: DC

## 2017-09-05 NOTE — Addendum Note (Signed)
Addended by: Gwenevere Abbot on: 09/05/2017 11:15 PM   Modules accepted: Orders

## 2017-09-05 NOTE — Patient Instructions (Addendum)
Your blood pressure is moderately well controlled today.  We will continue on losartan 50 mg daily.  For your high cholesterol, we will get CMP and lipid panel today.  For your history of diabetes, we will get A1c.  Last time your A1c was at a good level.  Follow lab and make adjustments if needed.  Good job on smoking cessation.  On review of chart today/prior CT chest, it was recommended that you repeat that.  Have placed order today.  If no call from [place] or radiology within a week please call here for update on appointment for that imaging study.  I am sending a message to our referral coordinator to get that authorized and scheduled.   Follow-up date to be determined after lab review and imaging review.

## 2017-09-05 NOTE — Progress Notes (Signed)
Subjective:    Patient ID: Donald Frazier Height Sr., male    DOB: 08-05-1950, 67 y.o.   MRN: [id number]  HPI   Pt in for follow up.   Pt has PAD. He states ambulating/functional. Feels better endurance. Legs feel tired. No cramping  Pt has upcoming abi that will be done by vascular specialist this summer.  Pt had history of B-12 low vitamin. He got injections and level went up.   Pt sugar was mild elevated sugar.  Pt is on metformin. Will get a1c today.  Pt is fasting today.  Pt states he stopped smoking since January. Reviewed cxr history and none done recently. He did have CT of chest in ED 07-15-2015. I saw Ct they ordered. Recommended repeat for pulmonary nodules. He followed up with me 14 months later for leg cramp complaint. Pt and son at time of ED eval was notified importance of getting CT of chest repeat.    Review of Systems  Constitutional: Negative for chills, fatigue and fever.  Respiratory: Negative for cough, chest tightness, shortness of breath and wheezing.   Cardiovascular: Negative for chest pain and palpitations.  Gastrointestinal: Negative for abdominal distention, abdominal pain, blood in stool, constipation and diarrhea.  Genitourinary: Negative for decreased urine volume, flank pain, frequency, penile swelling and testicular pain.  Musculoskeletal: Negative for back pain.  Skin: Negative for rash.  Neurological: Negative for dizziness, speech difficulty, weakness, numbness and headaches.  Hematological: Negative for adenopathy. Does not bruise/bleed easily.  Psychiatric/Behavioral: Negative for behavioral problems, decreased concentration and hallucinations. The patient is not nervous/anxious.    No past medical history on file.   Social History   Socioeconomic History  . Marital status: Married    Spouse name: Not on file  . Number of children: Not on file  . Years of education: Not on file  . Highest education level: Not on file  Occupational History    . Not on file  Social Needs  . Financial resource strain: Not on file  . Food insecurity:    Worry: Not on file    Inability: Not on file  . Transportation needs:    Medical: Not on file    Non-medical: Not on file  Tobacco Use  . Smoking status: Former Smoker    Packs/day: 1.00    Years: 60.00    Pack years: 60.00    Types: Cigarettes    Last attempt to quit: 04/23/2017    Years since quitting: 0.3  . Smokeless tobacco: Never Used  Substance and Sexual Activity  . Alcohol use: Yes    Comment: 1 can beer a night.(no heavy drinking recently)  . Drug use: No  . Sexual activity: Never  Lifestyle  . Physical activity:    Days per week: Not on file    Minutes per session: Not on file  . Stress: Not on file  Relationships  . Social connections:    Talks on phone: Not on file    Gets together: Not on file    Attends religious service: Not on file    Active member of club or organization: Not on file    Attends meetings of clubs or organizations: Not on file    Relationship status: Not on file  . Intimate partner violence:    Fear of current or ex partner: Not on file    Emotionally abused: Not on file    Physically abused: Not on file    Forced sexual activity: Not  on file  Other Topics Concern  . Not on file  Social History Narrative  . Not on file    Past Surgical History:  Procedure Laterality Date  . BACK SURGERY    . HEMORRHOID SURGERY    . SHOULDER SURGERY Left     Family History  Problem Relation Age of Onset  . Hypertension Mother   . Diabetes Mother   . Hypertension Father   . Diabetes Father     No Known Allergies  Current Outpatient Medications on File Prior to Visit  Medication Sig Dispense Refill  . aspirin EC 81 MG tablet Take 81 mg by mouth daily.    Marland Kitchen atorvastatin (LIPITOR) 10 MG tablet Take 1 tablet (10 mg total) by mouth daily. 90 tablet 0  . gabapentin (NEURONTIN) 100 MG capsule Take 1 capsule (100 mg total) by mouth 3 (three) times  daily. 90 capsule 0  . losartan (COZAAR) 25 MG tablet 2 tab po q day 60 tablet 3  . losartan (COZAAR) 50 MG tablet Take 1 tablet (50 mg total) by mouth daily. 90 tablet 1  . metFORMIN (GLUCOPHAGE-XR) 500 MG 24 hr tablet Take 1 tablet (500 mg total) by mouth daily with breakfast. 90 tablet 2   No current facility-administered medications on file prior to visit.     BP 140/80   Pulse 64   Temp (!) 97.5 F (36.4 C) (Oral)   Resp 16   Ht  (1.88 m)   Wt 182 lb 6.4 oz (82.7 kg)   SpO2 96%   BMI 23.42 kg/m       Objective:   Physical Exam  General Mental Status- Alert. General Appearance- Not in acute distress.   Skin General: Color- Normal Color. Moisture- Normal Moisture.  Neck Carotid Arteries- Normal color. Moisture- Normal Moisture. No carotid bruits. No JVD.  Chest and Lung Exam Auscultation: Breath Sounds:-Normal.  Cardiovascular Auscultation:Rythm- Regular. Murmurs & Other Heart Sounds:Auscultation of the heart reveals- No Murmurs.  Abdomen Inspection:-Inspeection Normal. Palpation/Percussion:Note:No mass. Palpation and Percussion of the abdomen reveal- Non Tender, Non Distended + BS, no rebound or guarding.    Neurologic Cranial Nerve exam:- CN III-XII intact(No nystagmus), symmetric smile. tStrength:- 5/5 equal and symmetric strength both upper and lower extremities.      Assessment & Plan:  Your blood pressure is moderately well controlled today.  We will continue on losartan 50 mg daily.  For your high cholesterol, we will get CMP and lipid panel today.  For your history of diabetes, we will get A1c.  Last time your A1c was at a good level.  Follow lab and make adjustments if needed.  Good job on smoking cessation.  On review of chart today/prior CT, it was recommended that you repeat that.  Have placed order today.  If no call from [place] or radiology within a week please call here for update on appointment for that imaging study.  I am sending a  message to our referral coordinator to get that authorized and scheduled.  Follow-up date to be determined after lab review and imaging review.  Esperanza Richters, PA-C

## 2017-09-05 NOTE — Telephone Encounter (Signed)
I placed CT of chest order today.  Please see that order.  It is extremely important that patient gets this.  Please work on it and try to get the study done within a week.  Let me know if there is any problems regarding authorization etc.

## 2017-09-07 LAB — QUANTIFERON-TB GOLD PLUS
Mitogen-NIL: 7.49 IU/mL
NIL: 0.02 IU/mL
QuantiFERON-TB Gold Plus: NEGATIVE
TB1-NIL: 0 IU/mL
TB2-NIL: 0 [IU]/mL

## 2017-09-10 ENCOUNTER — Ambulatory Visit (HOSPITAL_BASED_OUTPATIENT_CLINIC_OR_DEPARTMENT_OTHER)
Admission: RE | Admit: 2017-09-10 | Discharge: 2017-09-10 | Disposition: A | Discharge status: Home / Self Care | Payer: Medicare PPO | Source: Ambulatory Visit | Attending: Medical | Admitting: Medical

## 2017-09-10 DIAGNOSIS — I700 Atherosclerosis of aorta: Secondary | ICD-10-CM | POA: Diagnosis not present

## 2017-09-10 DIAGNOSIS — R911 Solitary pulmonary nodule: Secondary | ICD-10-CM | POA: Diagnosis not present

## 2017-09-10 DIAGNOSIS — R918 Other nonspecific abnormal finding of lung field: Secondary | ICD-10-CM | POA: Diagnosis present

## 2017-09-10 DIAGNOSIS — J439 Emphysema, unspecified: Secondary | ICD-10-CM | POA: Insufficient documentation

## 2017-09-10 DIAGNOSIS — K449 Diaphragmatic hernia without obstruction or gangrene: Secondary | ICD-10-CM | POA: Diagnosis not present

## 2017-09-10 DIAGNOSIS — Z87891 Personal history of nicotine dependence: Secondary | ICD-10-CM | POA: Diagnosis not present

## 2017-09-10 MED ORDER — IOPAMIDOL (ISOVUE-300) INJECTION 61%
100.0000 mL | Freq: Once | INTRAVENOUS | Status: AC | PRN
  Administered 2017-09-10: 80 mL via INTRAVENOUS

## 2017-09-26 NOTE — Progress Notes (Deleted)
Subjective:   Donald HeightJames A Huckeba Sr. is a 67 y.o. male who presents for Medicare Annual/Subsequent preventive examination.  Review of Systems: No ROS.  Medicare Wellness Visit. Additional risk factors are reflected in the social history.    Sleep patterns:  Home Safety/Smoke Alarms: Feels safe in home. Smoke alarms in place.  Living environment; residence and Firearm Safety:  Eye:  Male:   CCS-     PSA- No results found for: PSA      Objective:    Vitals: There were no vitals taken for this visit.  There is no height or weight on file to calculate BMI.  Advanced Directives 04/27/2017 10/02/2016 12/24/2015 07/15/2015 03/07/2014  Does Patient Have a Medical Advance Directive? No No No No No  Would patient like information on creating a medical advance directive? No - Patient declined Yes (MAU/Ambulatory/Procedural Areas - Information given) No - patient declined information - -    Tobacco Social History   Tobacco Use  Smoking Status Former Smoker  . Packs/day: 1.00  . Years: 60.00  . Pack years: 60.00  . Types: Cigarettes  . Last attempt to quit: 04/23/2017  . Years since quitting: 0.4  Smokeless Tobacco Never Used     Counseling given: Not Answered   Clinical Intake:                       No past medical history on file. Past Surgical History:  Procedure Laterality Date  . BACK SURGERY    . HEMORRHOID SURGERY    . SHOULDER SURGERY Left    Family History  Problem Relation Age of Onset  . Hypertension Mother   . Diabetes Mother   . Hypertension Father   . Diabetes Father    Social History   Socioeconomic History  . Marital status: Married    Spouse name: Not on file  . Number of children: Not on file  . Years of education: Not on file  . Highest education level: Not on file  Occupational History  . Not on file  Social Needs  . Financial resource strain: Not on file  . Food insecurity:    Worry: Not on file    Inability: Not on file  .  Transportation needs:    Medical: Not on file    Non-medical: Not on file  Tobacco Use  . Smoking status: Former Smoker    Packs/day: 1.00    Years: 60.00    Pack years: 60.00    Types: Cigarettes    Last attempt to quit: 04/23/2017    Years since quitting: 0.4  . Smokeless tobacco: Never Used  Substance and Sexual Activity  . Alcohol use: Yes    Comment: 1 can beer a night.(no heavy drinking recently)  . Drug use: No  . Sexual activity: Never  Lifestyle  . Physical activity:    Days per week: Not on file    Minutes per session: Not on file  . Stress: Not on file  Relationships  . Social connections:    Talks on phone: Not on file    Gets together: Not on file    Attends religious service: Not on file    Active member of club or organization: Not on file    Attends meetings of clubs or organizations: Not on file    Relationship status: Not on file  Other Topics Concern  . Not on file  Social History Narrative  . Not on file  Outpatient Encounter Medications as of 10/03/2017  Medication Sig  . aspirin EC 81 MG tablet Take 81 mg by mouth daily.  Marland Kitchen atorvastatin (LIPITOR) 10 MG tablet Take 1 tablet (10 mg total) by mouth daily.  Marland Kitchen gabapentin (NEURONTIN) 100 MG capsule Take 1 capsule (100 mg total) by mouth 3 (three) times daily.  Marland Kitchen losartan (COZAAR) 50 MG tablet Take 1 tablet (50 mg total) by mouth daily.  . metFORMIN (GLUCOPHAGE-XR) 500 MG 24 hr tablet Take 1 tablet (500 mg total) by mouth daily with breakfast.  . [DISCONTINUED] atorvastatin (LIPITOR) 10 MG tablet Take 1 tablet (10 mg total) by mouth daily.  . [DISCONTINUED] gabapentin (NEURONTIN) 100 MG capsule Take 1 capsule (100 mg total) by mouth 3 (three) times daily.  . [DISCONTINUED] losartan (COZAAR) 25 MG tablet 2 tab po q day  . [DISCONTINUED] losartan (COZAAR) 50 MG tablet Take 1 tablet (50 mg total) by mouth daily.  . [DISCONTINUED] metFORMIN (GLUCOPHAGE-XR) 500 MG 24 hr tablet Take 1 tablet (500 mg total) by  mouth daily with breakfast.   No facility-administered encounter medications on file as of 10/03/2017.     Activities of Daily Living In your present state of health, do you have any difficulty performing the following activities: 10/02/2016  Hearing? N  Vision? N  Difficulty concentrating or making decisions? N  Walking or climbing stairs? Y  Comment leg pain bilateral  Dressing or bathing? N  Doing errands, shopping? N  Preparing Food and eating ? N  Using the Toilet? N  In the past six months, have you accidently leaked urine? N  Do you have problems with loss of bowel control? N  Managing your Medications? N  Managing your Finances? N  Housekeeping or managing your Housekeeping? N  Some recent data might be hidden    Patient Care Team: Saguier, Kateri Mc as PCP - General (Physician Assistant)   Assessment:   This is a routine wellness examination for [name]. Physical assessment deferred to PCP.  Exercise Activities and Dietary recommendations   Diet (meal preparation, eat out, water intake, caffeinated beverages, dairy products, fruits and vegetables): {Desc; diets:16563} Breakfast: Lunch:  Dinner:             Goals    None      Fall Risk Fall Risk  10/02/2016 01/28/2015  Falls in the past year? No No    Depression Screen PHQ 2/9 Scores 10/02/2016 01/28/2015  PHQ - 2 Score 0 0    Cognitive Function MMSE - Mini Mental State Exam 10/02/2016  Orientation to time 5  Orientation to Place 5  Registration 3  Attention/ Calculation 4  Recall 2  Language- name 2 objects 2  Language- repeat 0  Language- follow 3 step command 3  Language- read & follow direction 1  Write a sentence 1  Copy design 1  Total score 27        Immunization History  Administered Date(s) Administered  . Influenza,inj,Quad PF,6+ Mos 01/28/2015  . Tdap 03/27/2017    Screening Tests Health Maintenance  Topic Date Due  . Hepatitis C Screening  27-Nov-1950  . OPHTHALMOLOGY EXAM   05/28/1958  . COLONOSCOPY  05/28/1998  . PNA vac Low Risk Adult (1 of 2 - PCV13) 05/28/2013  . INFLUENZA VACCINE  11/21/2017  . HEMOGLOBIN A1C  03/08/2018  . FOOT EXAM  09/06/2018  . TETANUS/TDAP  03/28/2027      Plan:   ***  I have personally reviewed and noted the  following in the patient's chart:   . Medical and social history . Use of alcohol, tobacco or illicit drugs  . Current medications and supplements . Functional ability and status . Nutritional status . Physical activity . Advanced directives . List of other physicians . Hospitalizations, surgeries, and ER visits in previous 12 months . Vitals . Screenings to include cognitive, depression, and falls . Referrals and appointments  In addition, I have reviewed and discussed with patient certain preventive protocols, quality metrics, and best practice recommendations. A written personalized care plan for preventive services as well as general preventive health recommendations were provided to patient.     [place]on Gully, [place]  09/26/2017

## 2017-10-03 ENCOUNTER — Telehealth: Payer: Self-pay

## 2017-10-03 ENCOUNTER — Ambulatory Visit: Payer: Medicare PPO | Admitting: *Deleted

## 2017-10-03 NOTE — Telephone Encounter (Signed)
Health Help paper work came in for patient. Dr. Abner GreenspanBlyth answered as many questions as possible. Patient needed to be contacted to finish the entire packet.  I have called patient 3 times  09/09/17 @9am  left message for patient to call the ofice back

## 2017-11-20 ENCOUNTER — Inpatient Hospital Stay (HOSPITAL_COMMUNITY): Admission: RE | Admit: 2017-11-20 | Payer: Medicare PPO | Source: Ambulatory Visit

## 2017-11-20 ENCOUNTER — Ambulatory Visit: Payer: Medicare PPO | Admitting: Vascular Surgery

## 2018-01-23 ENCOUNTER — Encounter: Payer: Self-pay | Admitting: Gastroenterology

## 2018-02-20 ENCOUNTER — Emergency Department (HOSPITAL_BASED_OUTPATIENT_CLINIC_OR_DEPARTMENT_OTHER)
Admission: EM | Admit: 2018-02-20 | Discharge: 2018-02-20 | Disposition: A | Discharge status: Home / Self Care | Payer: Medicare PPO | Attending: Emergency Medicine | Admitting: Emergency Medicine

## 2018-02-20 ENCOUNTER — Emergency Department (HOSPITAL_BASED_OUTPATIENT_CLINIC_OR_DEPARTMENT_OTHER): Payer: Medicare PPO

## 2018-02-20 ENCOUNTER — Encounter (HOSPITAL_BASED_OUTPATIENT_CLINIC_OR_DEPARTMENT_OTHER): Payer: Self-pay | Admitting: Emergency Medicine

## 2018-02-20 ENCOUNTER — Other Ambulatory Visit: Payer: Self-pay

## 2018-02-20 DIAGNOSIS — K5792 Diverticulitis of intestine, part unspecified, without perforation or abscess without bleeding: Secondary | ICD-10-CM | POA: Insufficient documentation

## 2018-02-20 DIAGNOSIS — Z87891 Personal history of nicotine dependence: Secondary | ICD-10-CM | POA: Insufficient documentation

## 2018-02-20 DIAGNOSIS — Z79899 Other long term (current) drug therapy: Secondary | ICD-10-CM | POA: Diagnosis not present

## 2018-02-20 DIAGNOSIS — R112 Nausea with vomiting, unspecified: Secondary | ICD-10-CM | POA: Insufficient documentation

## 2018-02-20 DIAGNOSIS — Z7984 Long term (current) use of oral hypoglycemic drugs: Secondary | ICD-10-CM | POA: Insufficient documentation

## 2018-02-20 DIAGNOSIS — R1030 Lower abdominal pain, unspecified: Secondary | ICD-10-CM | POA: Insufficient documentation

## 2018-02-20 DIAGNOSIS — Z7982 Long term (current) use of aspirin: Secondary | ICD-10-CM | POA: Insufficient documentation

## 2018-02-20 DIAGNOSIS — R109 Unspecified abdominal pain: Secondary | ICD-10-CM | POA: Diagnosis present

## 2018-02-20 LAB — COMPREHENSIVE METABOLIC PANEL
ALT: 15 U/L (ref 0–44)
ANION GAP: 13 (ref 5–15)
AST: 22 U/L (ref 15–41)
Albumin: 4.3 g/dL (ref 3.5–5.0)
Alkaline Phosphatase: 74 U/L (ref 38–126)
BILIRUBIN TOTAL: 1 mg/dL (ref 0.3–1.2)
BUN: 18 mg/dL (ref 8–23)
CO2: 25 mmol/L (ref 22–32)
Calcium: 10.5 mg/dL — ABNORMAL HIGH (ref 8.9–10.3)
Chloride: 100 mmol/L (ref 98–111)
Creatinine, Ser: 1.42 mg/dL — ABNORMAL HIGH (ref 0.61–1.24)
GFR, EST AFRICAN AMERICAN: 57 mL/min — AB (ref >60)
GFR, EST NON AFRICAN AMERICAN: 49 mL/min — AB (ref >60)
Glucose, Bld: 125 mg/dL — ABNORMAL HIGH (ref 70–99)
POTASSIUM: 4.6 mmol/L (ref 3.5–5.1)
Sodium: 138 mmol/L (ref 135–145)
TOTAL PROTEIN: 8.3 g/dL — AB (ref 6.5–8.1)

## 2018-02-20 LAB — CBC WITH DIFFERENTIAL/PLATELET
Abs Immature Granulocytes: 0.07 10*3/uL (ref 0.00–0.07)
Basophils Absolute: 0.1 10*3/uL (ref 0.0–0.1)
Basophils Relative: 1 %
Eosinophils Absolute: 0.1 10*3/uL (ref 0.0–0.5)
Eosinophils Relative: 1 %
HCT: 44.4 % (ref 39.0–52.0)
Hemoglobin: 14.2 g/dL (ref 13.0–17.0)
Immature Granulocytes: 1 %
Lymphocytes Relative: 17 %
Lymphs Abs: 1.9 10*3/uL (ref 0.7–4.0)
MCH: 28.3 pg (ref 26.0–34.0)
MCHC: 32 g/dL (ref 30.0–36.0)
MCV: 88.4 fL (ref 80.0–100.0)
Monocytes Absolute: 1 10*3/uL (ref 0.1–1.0)
Monocytes Relative: 9 %
Neutro Abs: 8 10*3/uL — ABNORMAL HIGH (ref 1.7–7.7)
Neutrophils Relative %: 71 %
Platelets: 350 10*3/uL (ref 150–400)
RBC: 5.02 MIL/uL (ref 4.22–5.81)
RDW: 15 % (ref 11.5–15.5)
WBC: 11 10*3/uL — ABNORMAL HIGH (ref 4.0–10.5)
nRBC: 0 % (ref 0.0–0.2)

## 2018-02-20 LAB — LIPASE, BLOOD: LIPASE: 28 U/L (ref 11–51)

## 2018-02-20 LAB — URINALYSIS, ROUTINE W REFLEX MICROSCOPIC
BILIRUBIN URINE: NEGATIVE
Glucose, UA: NEGATIVE mg/dL
HGB URINE DIPSTICK: NEGATIVE
KETONES UR: NEGATIVE mg/dL
Leukocytes, UA: NEGATIVE
Nitrite: NEGATIVE
Protein, ur: NEGATIVE mg/dL
SPECIFIC GRAVITY, URINE: 1.015 (ref 1.005–1.030)
pH: 7 (ref 5.0–8.0)

## 2018-02-20 MED ORDER — AMOXICILLIN-POT CLAVULANATE 875-125 MG PO TABS: 1.0000 | ORAL_TABLET | Freq: Two times a day (BID) | ORAL | 0 refills | Status: AC

## 2018-02-20 MED ORDER — ONDANSETRON HCL 4 MG/2ML IJ SOLN
4.0000 mg | Freq: Once | INTRAMUSCULAR | Status: AC
  Administered 2018-02-20: 4 mg via INTRAVENOUS
  Filled 2018-02-20: qty 2

## 2018-02-20 MED ORDER — SODIUM CHLORIDE 0.9 % IV BOLUS
1000.0000 mL | Freq: Once | INTRAVENOUS | Status: AC
  Administered 2018-02-20: 1000 mL via INTRAVENOUS

## 2018-02-20 MED ORDER — PIPERACILLIN-TAZOBACTAM 3.375 G IVPB 30 MIN
3.3750 g | Freq: Once | INTRAVENOUS | Status: AC
  Administered 2018-02-20: 3.375 g via INTRAVENOUS
  Filled 2018-02-20 (×2): qty 50

## 2018-02-20 MED ORDER — IOPAMIDOL (ISOVUE-300) INJECTION 61%
100.0000 mL | Freq: Once | INTRAVENOUS | Status: AC | PRN
  Administered 2018-02-20: 100 mL via INTRAVENOUS

## 2018-02-20 MED ORDER — PROMETHAZINE HCL 12.5 MG PO TABS: 12.5000 mg | ORAL_TABLET | Freq: Four times a day (QID) | ORAL | 0 refills | Status: AC | PRN

## 2018-02-20 MED ORDER — PROMETHAZINE HCL 25 MG/ML IJ SOLN
12.5000 mg | Freq: Once | INTRAMUSCULAR | Status: AC
  Administered 2018-02-20: 12.5 mg via INTRAVENOUS
  Filled 2018-02-20: qty 1

## 2018-02-20 MED ORDER — SODIUM CHLORIDE 0.9 % IV SOLN
INTRAVENOUS | Status: DC | PRN
  Administered 2018-02-20: 250 mL via INTRAVENOUS

## 2018-02-20 NOTE — ED Provider Notes (Signed)
[facility] EMERGENCY DEPARTMENT Provider Note   CSN: [id number] Arrival date & time: 02/20/18  1905     History   Chief Complaint Chief Complaint  Patient presents with  . Abdominal Pain    HPI Donald HOOGLAND Sr. is a 67 y.o. male here presenting with abdominal pain, vomiting.  Patient states that for the last 3 weeks he has intermittent nausea and vomiting.  He also told me that he had some lower abdominal pain as well that wakes him up from sleep.  Patient states that today he was unable to keep anything down.  Denies any fevers or diarrhea. Denies sick contacts.   The history is provided by the patient.    History reviewed. No pertinent past medical history.  Patient Active Problem List   Diagnosis Date Noted  . Pain in joint, shoulder region 08/10/2014  . Pain in the abdomen 07/08/2014    Past Surgical History:  Procedure Laterality Date  . BACK SURGERY    . HEMORRHOID SURGERY    . SHOULDER SURGERY Left         Home Medications    Prior to Admission medications   Medication Sig Start Date End Date Taking? Authorizing Provider  aspirin EC 81 MG tablet Take 81 mg by mouth daily.    [provider]  atorvastatin (LIPITOR) 10 MG tablet Take 1 tablet (10 mg total) by mouth daily. 09/05/17   Saguier, Ramon Dredge, PA-C  gabapentin (NEURONTIN) 100 MG capsule Take 1 capsule (100 mg total) by mouth 3 (three) times daily. 09/05/17   Saguier, Ramon Dredge, PA-C  losartan (COZAAR) 50 MG tablet Take 1 tablet (50 mg total) by mouth daily. 09/05/17   Saguier, Ramon Dredge, PA-C  metFORMIN (GLUCOPHAGE-XR) 500 MG 24 hr tablet Take 1 tablet (500 mg total) by mouth daily with breakfast. 09/05/17   Saguier, Ramon Dredge, PA-C    Family History Family History  Problem Relation Age of Onset  . Hypertension Mother   . Diabetes Mother   . Hypertension Father   . Diabetes Father     Social History Social History   Tobacco Use  . Smoking status: Former Smoker    Packs/day: 1.00     Years: 60.00    Pack years: 60.00    Types: Cigarettes    Last attempt to quit: 04/23/2017    Years since quitting: 0.8  . Smokeless tobacco: Never Used  Substance Use Topics  . Alcohol use: Yes    Comment: 1 can beer a night.(no heavy drinking recently)  . Drug use: No     Allergies   Patient has no known allergies.   Review of Systems Review of Systems  Gastrointestinal: Positive for abdominal pain and vomiting.  All other systems reviewed and are negative.    Physical Exam Updated Vital Signs BP (!) 144/101 (BP Location: Right Arm)   Pulse 91   Temp 98.6 F (37 C) (Oral)   Resp (!) 22   Ht 6\' 2"  (1.88 m)   Wt 87.1 kg   SpO2 93%   BMI 24.65 kg/m   Physical Exam  Constitutional: He is oriented to person, place, and time. He appears well-developed.  Dry heaving   HENT:  Head: Normocephalic.  Mouth/Throat: Oropharynx is clear and moist.  Eyes: Pupils are equal, round, and reactive to light. EOM are normal.  Cardiovascular: Normal rate, regular rhythm and normal heart sounds.  Pulmonary/Chest: Effort normal and breath sounds normal.  Abdominal: Normal appearance and bowel sounds are  normal.  Mild diffuse lower abdominal tenderness, no rebound or guarding   Neurological: He is alert and oriented to person, place, and time.  Skin: Skin is warm. Capillary refill takes less than 2 seconds.  Psychiatric: He has a normal mood and affect. His behavior is normal.  Nursing note and vitals reviewed.    ED Treatments / Results  Labs (all labs ordered are listed, but only abnormal results are displayed) Labs Reviewed  CBC WITH DIFFERENTIAL/PLATELET - Abnormal; Notable for the following components:      Result Value   WBC 11.0 (*)    Neutro Abs 8.0 (*)    All other components within normal limits  COMPREHENSIVE METABOLIC PANEL - Abnormal; Notable for the following components:   Glucose, Bld 125 (*)    Creatinine, Ser 1.42 (*)    Calcium 10.5 (*)    Total Protein  8.3 (*)    GFR calc non Af Amer 49 (*)    GFR calc Af Amer 57 (*)    All other components within normal limits  LIPASE, BLOOD  URINALYSIS, ROUTINE W REFLEX MICROSCOPIC    EKG None  Radiology Ct Abdomen Pelvis W Contrast  Result Date: 02/20/2018 CLINICAL DATA:  Patient states that for the last 3 weeks he has had N/V and abdominal pain EXAM: CT ABDOMEN AND PELVIS WITH CONTRAST TECHNIQUE: Multidetector CT imaging of the abdomen and pelvis was performed using the standard protocol following bolus administration of intravenous contrast. CONTRAST:  ISOVUE-300 IOPAMIDOL (ISOVUE-300) INJECTION 61% COMPARISON:  04/27/2017 FINDINGS: Lower chest: Aortic Atherosclerosis (ICD10-170.0). No pleural or pericardial effusion. Visualized lung bases clear. Hepatobiliary: No focal liver abnormality is seen. No gallstones, gallbladder wall thickening, or biliary dilatation. Pancreas: Unremarkable. No pancreatic ductal dilatation or surrounding inflammatory changes. Spleen: Normal in size without focal abnormality. Adrenals/Urinary Tract: Normal adrenals. Small bilateral renal cysts. No hydronephrosis. Urinary bladder incompletely distended. Stomach/Bowel: Stomach is nondistended. Small bowel decompressed. Normal appendix. Scattered descending and sigmoid diverticula. Mild inflammatory/edematous changes around the proximal sigmoid suggesting diverticulitis. No abscess. Vascular/Lymphatic: Infrarenal aortic occlusion extending through the bifurcation with reconstitution of external iliac flow bilaterally. Circumaortic left renal vein, an anatomic variant. No abdominal or pelvic adenopathy. Reproductive: Prostatic enlargement with central coarse calcifications. Other: No ascites.  No free air. Musculoskeletal: Mild bilateral hip DJD. Spondylitic changes in the lower lumbar spine. No fracture or worrisome bone lesion. IMPRESSION: 1. Suspect mild proximal sigmoid diverticulitis.  No abscess. 2. Aortic Atherosclerosis  (ICD10-170.0) with infrarenal aortic occlusion. Electronically Signed   By: Corlis Leak M.D.   On: 02/20/2018 20:44    Procedures Procedures (including critical care time)  Medications Ordered in ED Medications  0.9 %  sodium chloride infusion ( Intravenous Rate/Dose Verify 02/20/18 2214)  sodium chloride 0.9 % bolus 1,000 mL ( Intravenous Stopped 02/20/18 2043)  ondansetron (ZOFRAN) injection 4 mg (4 mg Intravenous Given 02/20/18 1930)  iopamidol (ISOVUE-300) 61 % injection 100 mL (100 mLs Intravenous Contrast Given 02/20/18 2020)  ondansetron (ZOFRAN) injection 4 mg (4 mg Intravenous Given 02/20/18 2048)  piperacillin-tazobactam (ZOSYN) IVPB 3.375 g ( Intravenous Stopped 02/20/18 2205)  promethazine (PHENERGAN) injection 12.5 mg (12.5 mg Intravenous Given 02/20/18 2126)  sodium chloride 0.9 % bolus 1,000 mL (1,000 mLs Intravenous New Bag/Given 02/20/18 2125)     Initial Impression / Assessment and Plan / ED Course  I have reviewed the triage vital signs and the nursing notes.  Pertinent labs & imaging results that were available during my care of the patient  were reviewed by me and considered in my medical decision making (see chart for details).    Donald Height Sr. is a 67 y.o. male here with abdominal pain, vomiting. Likely gastro vs ileus vs colitis. Will get labs, UA, CT ab/pel. Will hydrate and reassess.   10:28 PM Patient's labs showed WBC 11. CT showed diverticulitis with no abscess. Given zosyn in the ED. Tolerated PO after zofran, phenergan. Will dc home with augmentin. I doubt C diff and he was vomiting so will hold off on flagyl.    Final Clinical Impressions(s) / ED Diagnoses   Final diagnoses:  None    ED Discharge Orders    None       Charlynne Pander, MD 02/20/18 2230

## 2018-02-20 NOTE — ED Notes (Signed)
Patient transported to CT 

## 2018-02-20 NOTE — ED Notes (Signed)
PO challenge started

## 2018-02-20 NOTE — Discharge Instructions (Signed)
Take phenergan for nausea.   Stay hydrated.   Take augmentin twice daily for a week for diverticulitis.   See your doctor   Return to ER if you have worse abdominal pain, vomiting, dehydration.

## 2018-02-20 NOTE — ED Triage Notes (Signed)
Patient states that for the last 3 weeks he has had N/V and abdominal

## 2018-03-03 ENCOUNTER — Ambulatory Visit: Payer: Self-pay

## 2018-03-03 NOTE — Telephone Encounter (Signed)
   Reason for Disposition . Health Information question, no triage required and triager able to answer question  Answer Assessment - Initial Assessment Questions 1. REASON FOR CALL or QUESTION: "What is your reason for calling today?" or "How can I best help you?" or "What question do you have that I can help answer?"     Pt was dx with diverticulitis in the hospital and wife called to see  If pt could eat pinto beans. Wife advised pt not to eat pinto beans during a flare. Pinto beans are a fiber rich food that would be hard to digest during a flare of diverticulitis.  Protocols used: INFORMATION ONLY CALL-A-AH

## 2018-03-04 ENCOUNTER — Telehealth: Payer: Self-pay | Admitting: Medical

## 2018-03-04 ENCOUNTER — Encounter: Payer: Self-pay | Admitting: Medical

## 2018-03-04 ENCOUNTER — Ambulatory Visit: Payer: Medicare PPO | Admitting: Medical

## 2018-03-04 ENCOUNTER — Ambulatory Visit (HOSPITAL_BASED_OUTPATIENT_CLINIC_OR_DEPARTMENT_OTHER)
Admission: RE | Admit: 2018-03-04 | Discharge: 2018-03-04 | Disposition: A | Discharge status: Home / Self Care | Payer: Medicare PPO | Source: Ambulatory Visit | Attending: Medical | Admitting: Medical

## 2018-03-04 VITALS — BP 123/57 | HR 72 | Temp 97.6°F | Resp 16 | Ht 74.0 in | Wt 186.0 lb

## 2018-03-04 DIAGNOSIS — M5136 Other intervertebral disc degeneration, lumbar region: Secondary | ICD-10-CM | POA: Insufficient documentation

## 2018-03-04 DIAGNOSIS — D72829 Elevated white blood cell count, unspecified: Secondary | ICD-10-CM

## 2018-03-04 DIAGNOSIS — M792 Neuralgia and neuritis, unspecified: Secondary | ICD-10-CM | POA: Diagnosis not present

## 2018-03-04 DIAGNOSIS — M5441 Lumbago with sciatica, right side: Secondary | ICD-10-CM | POA: Insufficient documentation

## 2018-03-04 DIAGNOSIS — E538 Deficiency of other specified B group vitamins: Secondary | ICD-10-CM | POA: Diagnosis not present

## 2018-03-04 DIAGNOSIS — E118 Type 2 diabetes mellitus with unspecified complications: Secondary | ICD-10-CM | POA: Diagnosis not present

## 2018-03-04 DIAGNOSIS — I739 Peripheral vascular disease, unspecified: Secondary | ICD-10-CM

## 2018-03-04 DIAGNOSIS — G8929 Other chronic pain: Secondary | ICD-10-CM | POA: Insufficient documentation

## 2018-03-04 LAB — COMPREHENSIVE METABOLIC PANEL
ALBUMIN: 4.1 g/dL (ref 3.5–5.2)
ALT: 14 U/L (ref 0–53)
AST: 16 U/L (ref 0–37)
Alkaline Phosphatase: 71 U/L (ref 39–117)
BUN: 18 mg/dL (ref 6–23)
CO2: 28 mEq/L (ref 19–32)
Calcium: 9.5 mg/dL (ref 8.4–10.5)
Chloride: 106 mEq/L (ref 96–112)
Creatinine, Ser: 1.08 mg/dL (ref 0.40–1.50)
GFR: 86.99 mL/min (ref >60.00)
Glucose, Bld: 97 mg/dL (ref 70–99)
POTASSIUM: 4.4 meq/L (ref 3.5–5.1)
SODIUM: 141 meq/L (ref 135–145)
TOTAL PROTEIN: 7 g/dL (ref 6.0–8.3)
Total Bilirubin: 0.6 mg/dL (ref 0.2–1.2)

## 2018-03-04 LAB — CBC WITH DIFFERENTIAL/PLATELET
Basophils Absolute: 0 10*3/uL (ref 0.0–0.1)
Basophils Relative: 0.6 % (ref 0.0–3.0)
Eosinophils Absolute: 0.2 10*3/uL (ref 0.0–0.7)
Eosinophils Relative: 2.4 % (ref 0.0–5.0)
HCT: 42.5 % (ref 39.0–52.0)
Hemoglobin: 13.8 g/dL (ref 13.0–17.0)
LYMPHS ABS: 1.4 10*3/uL (ref 0.7–4.0)
Lymphocytes Relative: 19.6 % (ref 12.0–46.0)
MCHC: 32.6 g/dL (ref 30.0–36.0)
MCV: 88.6 fl (ref 78.0–100.0)
MONO ABS: 0.5 10*3/uL (ref 0.1–1.0)
MONOS PCT: 6.7 % (ref 3.0–12.0)
NEUTROS PCT: 70.7 % (ref 43.0–77.0)
Neutro Abs: 4.9 10*3/uL (ref 1.4–7.7)
Platelets: 411 10*3/uL — ABNORMAL HIGH (ref 150.0–400.0)
RBC: 4.79 Mil/uL (ref 4.22–5.81)
RDW: 16.3 % — ABNORMAL HIGH (ref 11.5–15.5)
WBC: 6.9 10*3/uL (ref 4.0–10.5)

## 2018-03-04 LAB — HEMOGLOBIN A1C: Hgb A1c MFr Bld: 6.6 % — ABNORMAL HIGH (ref 4.6–6.5)

## 2018-03-04 LAB — VITAMIN B12: VITAMIN B 12: 189 pg/mL — AB (ref 211–911)

## 2018-03-04 MED ORDER — GABAPENTIN 100 MG PO CAPS: 100.0000 mg | ORAL_CAPSULE | Freq: Three times a day (TID) | ORAL | 1 refills | Status: AC

## 2018-03-04 NOTE — Patient Instructions (Addendum)
You do have describe neuropathic/radicular pain from your lower back as well as described muscle weakness of lower extremities with activity.  Causes of your symptoms could be multifactorial.  This includes lower back pain with radiculopathy from spine, low B12, diabetic neuropathy and potential vascular cause related to severe peripheral vascular disease.  We will get CMP, A1c, B12 level and lumbar spine x-ray.  I am refilling your gabapentin today.  Please make sure that you are taking this daily and titrate up as explained.  Depending on x-ray findings might refer you to sports medicine for opinion whether or not MRI would be beneficial.  Your ABI was positive for arterial disease of both lower extremity.  I will go ahead and refer you back to vascular specialist and get opinion on recommended treatment.  Will ask lab to do cbc to evaluate you wbc count since elevated mild when you had diverticulitis. Clincially stable. If any recurrent pain let us now. In that event would likely round more antibiotic but then also get you in with GI.  Follow up in 2 weeks or as needed

## 2018-03-04 NOTE — Telephone Encounter (Signed)
Also diabetes by a1c under control. Recommend low sugar diet and continue with metformin  same dose.

## 2018-03-04 NOTE — Progress Notes (Signed)
Subjective:    Patient ID: Donald Height Sr., male    DOB: 1950/10/06, 67 y.o.   MRN: [id number]  HPI  Pt in for follow up from ED.  Pt states his stomach feels back to normal. He was given augmentin. He took med for 7 days and now feels better. Pt was vomiting in ED and on note review this is why flagyl not given.  Pt did have diverticulitis on ct but no abcess. His wbc was mid elevated. Finished antibiotic last Thursday.   Pt has some back pain recently and he is associated radicular pain all the way to his rt foot. In the past had leg weakness and poor endurance t both o legs. He does report chronic low back pain past 10 years.  Occasional cramps in his legs.  In past he had low b12 level. Treated and got b12 level up.  Pt has known diabetes. Since in past he diabetic nerve type pain. He is not taking b12 now but got im injection in the past.    Review of Systems  Constitutional: Negative for chills, fatigue and fever.  HENT: Negative for congestion and drooling.   Respiratory: Negative for cough, chest tightness, shortness of breath and wheezing.   Cardiovascular: Negative for chest pain and palpitations.  Gastrointestinal: Negative for abdominal distention, abdominal pain, constipation, nausea and vomiting.  Musculoskeletal: Positive for back pain. Negative for arthralgias, joint swelling, myalgias, neck pain and neck stiffness.       Occasional leg cramps  Skin: Negative for rash.  Neurological: Positive for weakness. Negative for dizziness, tremors, seizures, speech difficulty, numbness and headaches.       Weakness of legs when walks.  Neuropathic pain.  Hematological: Negative for adenopathy. Does not bruise/bleed easily.  Psychiatric/Behavioral: Negative for behavioral problems and confusion.    No past medical history on file.   Social History   Socioeconomic History  . Marital status: Married    Spouse name: Not on file  . Number of children: Not on  file  . Years of education: Not on file  . Highest education level: Not on file  Occupational History  . Not on file  Social Needs  . Financial resource strain: Not on file  . Food insecurity:    Worry: Not on file    Inability: Not on file  . Transportation needs:    Medical: Not on file    Non-medical: Not on file  Tobacco Use  . Smoking status: Former Smoker    Packs/day: 1.00    Years: 60.00    Pack years: 60.00    Types: Cigarettes    Last attempt to quit: 04/23/2017    Years since quitting: 0.8  . Smokeless tobacco: Never Used  Substance and Sexual Activity  . Alcohol use: Yes    Comment: 1 can beer a night.(no heavy drinking recently)  . Drug use: No  . Sexual activity: Never  Lifestyle  . Physical activity:    Days per week: Not on file    Minutes per session: Not on file  . Stress: Not on file  Relationships  . Social connections:    Talks on phone: Not on file    Gets together: Not on file    Attends religious service: Not on file    Active member of club or organization: Not on file    Attends meetings of clubs or organizations: Not on file    Relationship status: Not on file  .  Intimate partner violence:    Fear of current or ex partner: Not on file    Emotionally abused: Not on file    Physically abused: Not on file    Forced sexual activity: Not on file  Other Topics Concern  . Not on file  Social History Narrative  . Not on file    Past Surgical History:  Procedure Laterality Date  . BACK SURGERY    . HEMORRHOID SURGERY    . SHOULDER SURGERY Left     Family History  Problem Relation Age of Onset  . Hypertension Mother   . Diabetes Mother   . Hypertension Father   . Diabetes Father     No Known Allergies  Current Outpatient Medications on File Prior to Visit  Medication Sig Dispense Refill  . amoxicillin-clavulanate (AUGMENTIN) 875-125 MG tablet Take 1 tablet by mouth 2 (two) times daily. One po bid x 7 days 14 tablet 0  . aspirin EC  81 MG tablet Take 81 mg by mouth daily.    Marland Kitchen. atorvastatin (LIPITOR) 10 MG tablet Take 1 tablet (10 mg total) by mouth daily. 90 tablet 0  . losartan (COZAAR) 50 MG tablet Take 1 tablet (50 mg total) by mouth daily. 90 tablet 1  . metFORMIN (GLUCOPHAGE-XR) 500 MG 24 hr tablet Take 1 tablet (500 mg total) by mouth daily with breakfast. 90 tablet 2  . promethazine (PHENERGAN) 12.5 MG tablet Take 1 tablet (12.5 mg total) by mouth every 6 (six) hours as needed for nausea or vomiting. 10 tablet 0   No current facility-administered medications on file prior to visit.     BP (!) 123/57   Pulse 72   Temp 97.6 F (36.4 C) (Oral)   Resp 16   Ht 6\' 2"  (1.88 m)   Wt 186 lb (84.4 kg)   SpO2 96%   BMI 23.88 kg/m       Objective:   Physical Exam  General Mental Status- Alert. General Appearance- Not in acute distress.   Skin General: Color- Normal Color. Moisture- Normal Moisture.  Neck Carotid Arteries- Normal color. Moisture- Normal Moisture. No carotid bruits. No JVD.  Chest and Lung Exam Auscultation: Breath Sounds:-Normal.  Cardiovascular Auscultation:Rythm- Regular. Murmurs & Other Heart Sounds:Auscultation of the heart reveals- No Murmurs.  Abdomen Inspection:-Inspeection Normal. Palpation/Percussion:Note:No mass. Palpation and Percussion of the abdomen reveal- Non Tender, Non Distended + BS, no rebound or guarding.    Neurologic Cranial Nerve exam:- CN III-XII intact(No nystagmus), symmetric smile. Strength:- 5/5 equal and symmetric strength both upper and lower extremities.  Lower ext- see quality metrics.     Back No Mid lumbar spine tenderness to palpation. Rt si tenderness mild. Pain on straight leg lift. Pain on lateral movements and flexion/extension of the spine.  Lower ext neurologic  L5-S1 sensation intact bilaterally. Normal patellar reflexes bilaterally. No foot drop bilaterally.  Abdomen- soft, nt, nd, +bs, no rebound or guarding.        Assessment & Plan:  You do have describe neuropathic/radicular pain from your lower back as well as described muscle weakness of lower extremities with activity.  Causes of your symptoms could be multifactorial.  This includes lower back pain with radiculopathy from spine, low B12, diabetic neuropathy and potential vascular cause related to severe peripheral vascular disease.  We will get CMP, A1c, B12 level and lumbar spine x-ray.  I am refilling your gabapentin today.  Please make sure that you are taking this daily and titrate up as  explained.  Depending on x-ray findings might refer you to sports medicine for opinion whether or not MRI would be beneficial.  Your ABI was positive for arterial disease of both lower extremity.  I will go ahead and refer you back to vascular specialist and get opinion on recommended treatment.  Will ask lab to do cbc to evaluate you wbc count since elevated mild when you had diverticulitis. Clincially stable. If any recurrent pain let us now. In that event would likely round more antibiotic but then also get you in with GI.  Follow up in 2 weeks or as needed  [employer], PA-C

## 2018-03-07 ENCOUNTER — Telehealth: Payer: Self-pay | Admitting: Medical

## 2018-03-07 NOTE — Telephone Encounter (Signed)
Pt's wife notified.

## 2018-03-07 NOTE — Telephone Encounter (Signed)
Attempted to call patient to schedule AWV. Patient did not answer. Will try to call patient at a later time. SF °

## 2018-03-11 ENCOUNTER — Encounter: Payer: Medicare PPO | Admitting: Gastroenterology

## 2018-03-25 ENCOUNTER — Other Ambulatory Visit: Payer: Self-pay | Admitting: Medical

## 2018-03-25 MED ORDER — ATORVASTATIN CALCIUM 10 MG PO TABS: 10.0000 mg | ORAL_TABLET | Freq: Every day | ORAL | 0 refills | Status: AC

## 2018-04-14 ENCOUNTER — Telehealth: Payer: Self-pay

## 2018-04-14 NOTE — Telephone Encounter (Signed)
Copied from CRM #[id number]. Topic: General - Deceased Patient >> Apr 14, 2018 11:28 AM Frazier, Donald E wrote: Reason for CRM: Pt wife called and stated that Pt died from a tractor accident. Pt died on 12.07.2019  Route to department's PEC Pool. 

## 2018-04-14 NOTE — Telephone Encounter (Signed)
Copied from CRM #[id number]. Topic: General - Deceased Patient >> Apr 14, 2018 11:28 AM [name], Chaz E wrote: Reason for CRM: Pt wife called and stated that Pt died from a tractor accident. Pt died on 12.07.2019  Route to department's PEC Pool. 

## 2018-04-14 NOTE — Telephone Encounter (Signed)
Copied from CRM [phone]. Topic: General - Deceased Patient >> Apr 14, 2018  2:15 PM Lurline Harearter, Krystalynn Ridgeway E, LPN wrote: Information given to E. Saguier,PA-C

## 2018-04-20 ENCOUNTER — Telehealth: Payer: Self-pay | Admitting: Medical

## 2018-04-20 NOTE — Telephone Encounter (Signed)
Copied from CRM [phone]. Topic: General - Deceased Patient >> Apr 14, 2018 11:37 AM Lurline Harearter, Karen E, LPN wrote: Information forwarded to provider.  Pt deceased. Talked to pt wife.

## 2018-04-20 NOTE — Telephone Encounter (Signed)
Closing crm. Talked with wife.

## 2018-04-20 NOTE — Telephone Encounter (Signed)
Closed crm. Talked with pt wife.

## 2018-04-21 ENCOUNTER — Telehealth: Payer: Self-pay

## 2018-04-21 NOTE — Telephone Encounter (Signed)
Copied from CRM #[id number]. Topic: General - Deceased Patient >> Apr 14, 2018 11:28 AM Frazier, Donald E wrote: Reason for CRM: Pt wife called and stated that Pt died from a tractor accident. Pt died on 12.07.2019  Route to department's PEC Pool. 

## 2018-04-21 NOTE — Telephone Encounter (Signed)
Copied from CRM #[id number]. [phone]: General - Deceased Patient >> Apr 14, 2018 11:37 AM Lurline Harearter, Tedra Coppernoll E, LPN wrote: Information forwarded to provider.

## 2018-04-21 NOTE — Telephone Encounter (Signed)
Copied from CRM #[id number]. Topic: General - Deceased Patient >> Apr 14, 2018 11:28 AM [name], Donald Frazier wrote: Reason for CRM: Pt wife called and stated that Pt died from a tractor accident. Pt died on 12.07.2019  Route to department's PEC Pool. 

## 2018-04-23 DEATH — deceased

## 2018-05-05 ENCOUNTER — Telehealth: Payer: Self-pay

## 2018-05-05 NOTE — Telephone Encounter (Signed)
Copied from CRM [phone]. Topic: General - Deceased Patient >> Apr 14, 2018 11:28 AM Wyonia HoughJohnson, Donald E wrote: Reason for CRM: Pt wife called and stated that Pt died from a tractor accident. Pt died on 12.07.2019  Route to department's PEC Pool.

## 2018-05-14 ENCOUNTER — Ambulatory Visit: Payer: Medicare PPO | Admitting: Vascular Surgery

## 2018-05-14 ENCOUNTER — Encounter (HOSPITAL_COMMUNITY): Payer: Medicare PPO

## 2020-05-29 IMAGING — DX DG LUMBAR SPINE 2-3V
3 series · 3 of 3 positions shown · non-contrast
Comparison: CT 02/20/2018

CLINICAL DATA: Chronic low back pain.

EXAM:
LUMBAR SPINE - 2-3 VIEW

[l-spine ap]
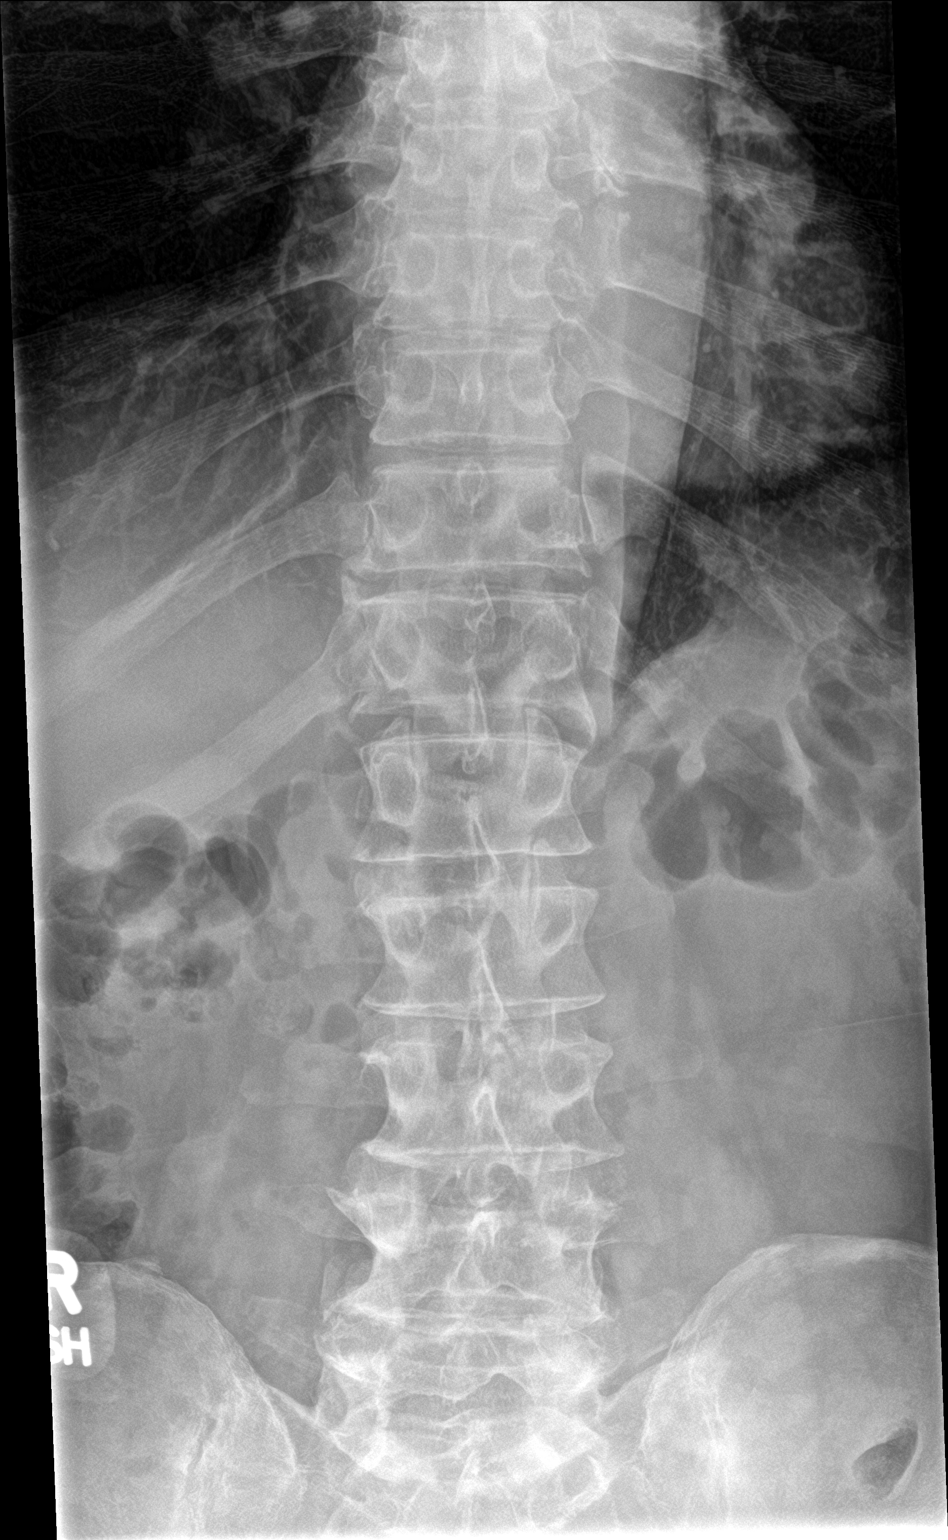

[l-spine lat]
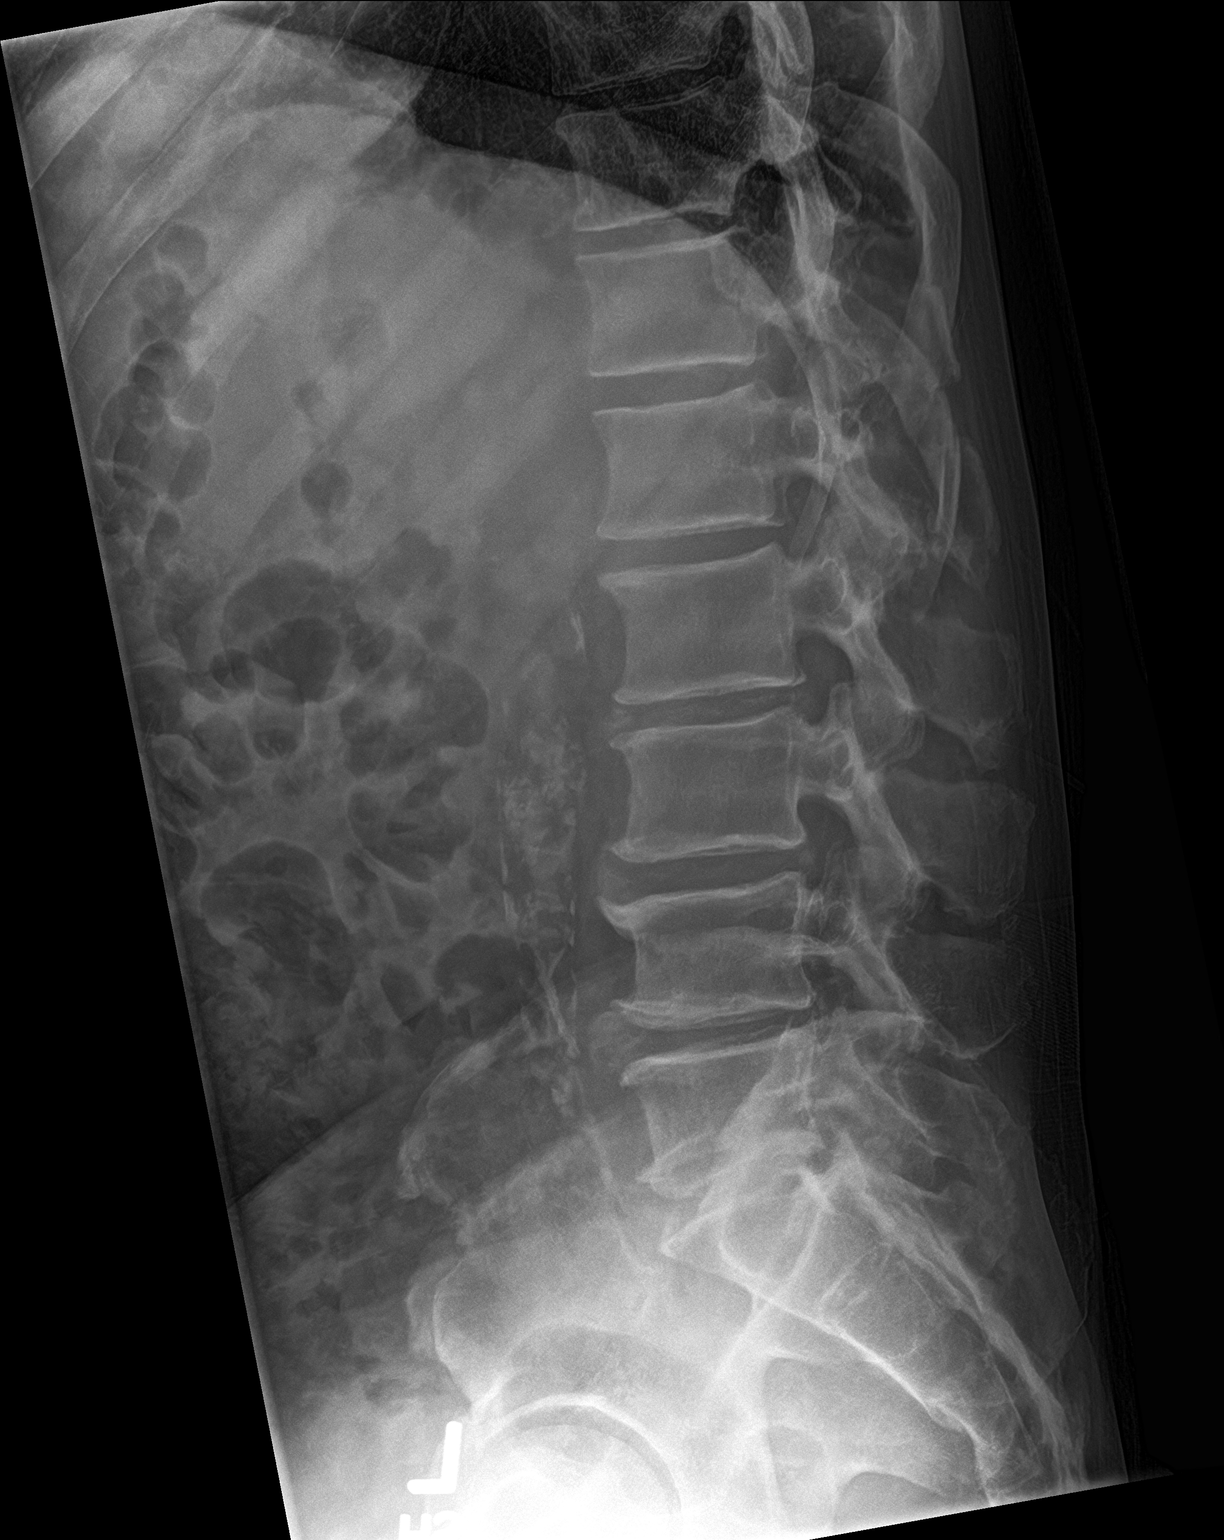

[l-spine spot]
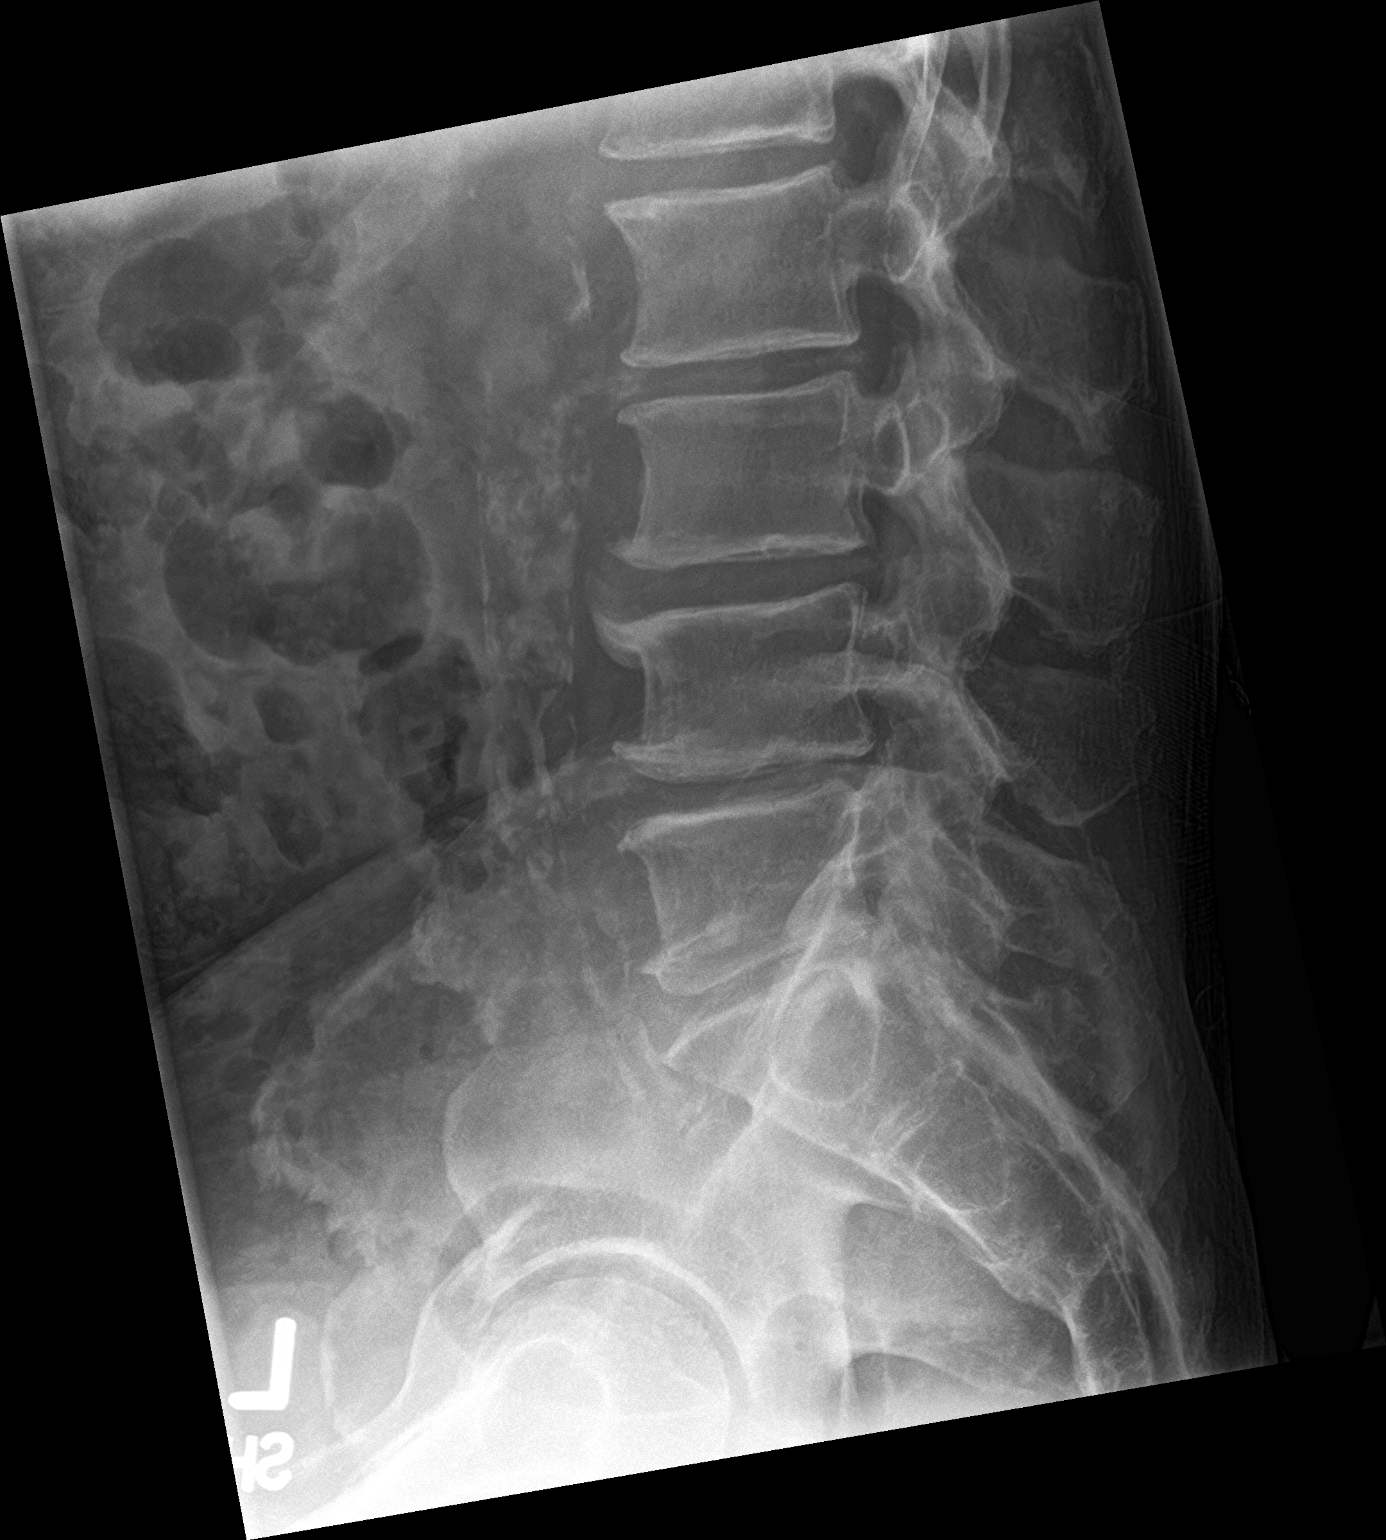

[3 of 3 positions shown; findings below may reference images not displayed]

FINDINGS: Diffuse degenerative disc disease changes with disc space narrowing
and spurring. Mild diffuse degenerative facet disease. Normal
alignment. No fracture. SI joints are symmetric and unremarkable.
IMPRESSION: Diffuse degenerative disc and facet disease. No acute bony
abnormality.
